# Patient Record
Sex: Female | Born: 1975 | Hispanic: No | Marital: Single | Smoking: Current some day smoker
Health system: Southern US, Community
[De-identification: ages and names within clinical notes are randomized; demographics above are authoritative.]

## PROBLEM LIST (undated history)

## (undated) DIAGNOSIS — F604 Histrionic personality disorder: Secondary | ICD-10-CM

## (undated) DIAGNOSIS — F1994 Other psychoactive substance use, unspecified with psychoactive substance-induced mood disorder: Secondary | ICD-10-CM

## (undated) DIAGNOSIS — R45851 Suicidal ideations: Secondary | ICD-10-CM

## (undated) DIAGNOSIS — F411 Generalized anxiety disorder: Secondary | ICD-10-CM

## (undated) DIAGNOSIS — N390 Urinary tract infection, site not specified: Secondary | ICD-10-CM

## (undated) DIAGNOSIS — F332 Major depressive disorder, recurrent severe without psychotic features: Secondary | ICD-10-CM

## (undated) DIAGNOSIS — F419 Anxiety disorder, unspecified: Secondary | ICD-10-CM

## (undated) DIAGNOSIS — F3162 Bipolar disorder, current episode mixed, moderate: Secondary | ICD-10-CM

## (undated) DIAGNOSIS — F1410 Cocaine abuse, uncomplicated: Secondary | ICD-10-CM

## (undated) DIAGNOSIS — I440 Atrioventricular block, first degree: Secondary | ICD-10-CM

## (undated) DIAGNOSIS — F17200 Nicotine dependence, unspecified, uncomplicated: Secondary | ICD-10-CM

## (undated) DIAGNOSIS — L03213 Periorbital cellulitis: Secondary | ICD-10-CM

## (undated) DIAGNOSIS — F1320 Sedative, hypnotic or anxiolytic dependence, uncomplicated: Secondary | ICD-10-CM

## (undated) DIAGNOSIS — F1120 Opioid dependence, uncomplicated: Secondary | ICD-10-CM

## (undated) DIAGNOSIS — L700 Acne vulgaris: Secondary | ICD-10-CM

## (undated) DIAGNOSIS — F319 Bipolar disorder, unspecified: Secondary | ICD-10-CM

## (undated) DIAGNOSIS — F1011 Alcohol abuse, in remission: Secondary | ICD-10-CM

## (undated) HISTORY — DX: Periorbital cellulitis: L03.213

## (undated) HISTORY — DX: Cocaine abuse, uncomplicated: F14.10

## (undated) HISTORY — DX: Sedative, hypnotic or anxiolytic dependence, uncomplicated: F13.20

## (undated) HISTORY — DX: Opioid dependence, uncomplicated: F11.20

## (undated) HISTORY — DX: Bipolar disorder, current episode mixed, moderate: F31.62

## (undated) HISTORY — DX: Alcohol abuse, in remission: F10.11

## (undated) HISTORY — DX: Nicotine dependence, unspecified, uncomplicated: F17.200

## (undated) HISTORY — DX: Generalized anxiety disorder: F41.1

## (undated) HISTORY — DX: Major depressive disorder, recurrent severe without psychotic features: F33.2

## (undated) HISTORY — DX: Histrionic personality disorder: F60.4

## (undated) HISTORY — DX: Atrioventricular block, first degree: I44.0

## (undated) HISTORY — DX: Suicidal ideations: R45.851

## (undated) HISTORY — DX: Urinary tract infection, site not specified: N39.0

## (undated) HISTORY — DX: Other psychoactive substance use, unspecified with psychoactive substance-induced mood disorder: F19.94

---

## 2004-11-26 ENCOUNTER — Ambulatory Visit: Payer: Self-pay | Admitting: Internal Medicine

## 2013-08-10 DIAGNOSIS — F411 Generalized anxiety disorder: Secondary | ICD-10-CM | POA: Insufficient documentation

## 2013-08-10 HISTORY — DX: Generalized anxiety disorder: F41.1

## 2013-08-14 DIAGNOSIS — F319 Bipolar disorder, unspecified: Secondary | ICD-10-CM | POA: Insufficient documentation

## 2014-08-12 ENCOUNTER — Emergency Department (HOSPITAL_BASED_OUTPATIENT_CLINIC_OR_DEPARTMENT_OTHER)
Admission: EM | Admit: 2014-08-12 | Discharge: 2014-08-12 | Disposition: A | Discharge status: Home / Self Care | Payer: Self-pay | Attending: Emergency Medicine | Admitting: Emergency Medicine

## 2014-08-12 ENCOUNTER — Encounter (HOSPITAL_BASED_OUTPATIENT_CLINIC_OR_DEPARTMENT_OTHER): Payer: Self-pay | Admitting: Emergency Medicine

## 2014-08-12 DIAGNOSIS — J020 Streptococcal pharyngitis: Secondary | ICD-10-CM | POA: Insufficient documentation

## 2014-08-12 DIAGNOSIS — R110 Nausea: Secondary | ICD-10-CM | POA: Insufficient documentation

## 2014-08-12 DIAGNOSIS — Z720 Tobacco use: Secondary | ICD-10-CM | POA: Insufficient documentation

## 2014-08-12 DIAGNOSIS — F419 Anxiety disorder, unspecified: Secondary | ICD-10-CM | POA: Insufficient documentation

## 2014-08-12 HISTORY — DX: Anxiety disorder, unspecified: F41.9

## 2014-08-12 LAB — RAPID STREP SCREEN (MED CTR MEBANE ONLY): STREPTOCOCCUS, GROUP A SCREEN (DIRECT): POSITIVE — AB

## 2014-08-12 MED ORDER — PENICILLIN G BENZATHINE 1200000 UNIT/2ML IM SUSP
1.2000 10*6.[IU] | Freq: Once | INTRAMUSCULAR | Status: AC
  Administered 2014-08-12: 1.2 10*6.[IU] via INTRAMUSCULAR
  Filled 2014-08-12: qty 2

## 2014-08-12 MED ORDER — ONDANSETRON 4 MG PO TBDP
4.0000 mg | ORAL_TABLET | Freq: Once | ORAL | Status: AC
  Administered 2014-08-12: 4 mg via ORAL
  Filled 2014-08-12: qty 1

## 2014-08-12 MED ORDER — DEXAMETHASONE SODIUM PHOSPHATE 10 MG/ML IJ SOLN
10.0000 mg | Freq: Once | INTRAMUSCULAR | Status: AC
  Administered 2014-08-12: 10 mg via INTRAMUSCULAR
  Filled 2014-08-12: qty 1

## 2014-08-12 MED ORDER — IBUPROFEN 800 MG PO TABS
800.0000 mg | ORAL_TABLET | Freq: Once | ORAL | Status: AC
  Administered 2014-08-12: 800 mg via ORAL
  Filled 2014-08-12: qty 1

## 2014-08-12 MED ORDER — LORAZEPAM 1 MG PO TABS
1.0000 mg | ORAL_TABLET | Freq: Once | ORAL | Status: AC
  Administered 2014-08-12: 1 mg via ORAL
  Filled 2014-08-12: qty 1

## 2014-08-12 NOTE — Discharge Instructions (Signed)
1. Medications: usual home medications 2. Treatment: rest, drink plenty of fluids,  3. Follow Up: Please followup with your primary doctor in 3 days for discussion of your diagnoses and further evaluation after today's visit; if you do not have a primary care doctor use the resource guide provided to find one; Please return to the ER for inability to swallow, worsening pain, high fevers   Strep Throat Strep throat is an infection of the throat. It is caused by a germ. Strep throat spreads from person to person by coughing, sneezing, or close contact. HOME CARE  Rinse your mouth (gargle) with warm salt water (1 teaspoon salt in 1 cup of water). Do this 3 to 4 times per day or as needed for comfort.  Family members with a sore throat or fever should see a doctor.  Make sure everyone in your house washes their hands well.  Do not share food, drinking cups, or personal items.  Eat soft foods until your sore throat gets better.  Drink enough water and fluids to keep your pee (urine) clear or pale yellow.  Rest.  Stay home from school, daycare, or work until you have taken medicine for 24 hours.  Only take medicine as told by your doctor.  Take your medicine as told. Finish it even if you start to feel better. GET HELP RIGHT AWAY IF:   You have new problems, such as throwing up (vomiting) or bad headaches.  You have a stiff or painful neck, chest pain, trouble breathing, or trouble swallowing.  You have very bad throat pain, drooling, or changes in your voice.  Your neck puffs up (swells) or gets red and tender.  You have a fever.  You are very tired, your mouth is dry, or you are peeing less than normal.  You cannot wake up completely.  You get a rash, cough, or earache.  You have green, yellow-brown, or bloody spit.  Your pain does not get better with medicine. MAKE SURE YOU:   Understand these instructions.  Will watch your condition.  Will get help right away if  you are not doing well or get worse. Document Released: 03/22/2008 Document Revised: 12/27/2011 Document Reviewed: 12/03/2010 [facility] Patient Information 2015 [employer] [place]. This information is not intended to replace advice given to you by your health care provider. Make sure you discuss any questions you have with your health care provider.

## 2014-08-12 NOTE — ED Notes (Signed)
Pt c/o sore throat since this morning. Pt also has anxiety and is out of her klonopin and cannot pick up her next Rx until Thurs.

## 2014-08-12 NOTE — ED Provider Notes (Signed)
CSN: [id number]     Arrival date & time 08/12/14  1755 History   First MD Initiated Contact with Patient 08/12/14 2020     Chief Complaint  Patient presents with  . Sore Throat     (Consider location/radiation/quality/duration/timing/severity/associated sxs/prior Treatment) Patient is a 38 y.o. female presenting with pharyngitis. The history is provided by the patient and medical records. No language interpreter was used.  Sore Throat Associated symptoms include nausea and a sore throat. Pertinent negatives include no abdominal pain, chest pain, coughing, diaphoresis, fatigue, fever, headaches, rash or vomiting.    Mia [name] is a 38 y.o. female  with a hx of anxiety presents to the Emergency Department complaining of gradual, persistent, progressively worsening sore throat with associated subjective fever, chills and decreased by mouth intake onset this morning.  She also endorses associated nausea and mild headache. Patient denies aggravating or alleviating factors. Patient reports no over-the-counter treatments attempted.  Pt denies neck pain, neck stiffness, chest pain, shortness of breath, abdominal pain, weakness, dizziness, syncope.  His also requests refill of her Klonopin. Patient reports she has a primary care and she has a refill that will be ready on Thursday however she does not want to wait.  Past Medical History  Diagnosis Date  . Anxiety    Past Surgical History  Procedure Laterality Date  . Cesarean section     No family history on file. History  Substance Use Topics  . Smoking status: Current Some Day Smoker  . Smokeless tobacco: Not on file  . Alcohol Use: No   OB History   Grav Para Term Preterm Abortions TAB SAB Ect Mult Living                 Review of Systems  Constitutional: Negative for fever, diaphoresis, appetite change, fatigue and unexpected weight change.  HENT: Positive for sore throat. Negative for mouth sores.   Eyes: Negative for  visual disturbance.  Respiratory: Negative for cough, chest tightness, shortness of breath and wheezing.   Cardiovascular: Negative for chest pain.  Gastrointestinal: Positive for nausea. Negative for vomiting, abdominal pain, diarrhea and constipation.  Endocrine: Negative for polydipsia, polyphagia and polyuria.  Genitourinary: Negative for dysuria, urgency, frequency and hematuria.  Musculoskeletal: Negative for back pain and neck stiffness.  Skin: Negative for rash.  Allergic/Immunologic: Negative for immunocompromised state.  Neurological: Negative for syncope, light-headedness and headaches.  Hematological: Positive for adenopathy. Does not bruise/bleed easily.  Psychiatric/Behavioral: Negative for sleep disturbance. The patient is not nervous/anxious.       Allergies  Review of patient's allergies indicates no known allergies.  Home Medications   Prior to Admission medications   Medication Sig Start Date End Date Taking? Authorizing Provider  clonazePAM (KLONOPIN) 1 MG tablet Take 1 mg by mouth 2 (two) times daily.   Yes Historical Provider, MD   BP 135/88  Pulse 80  Temp(Src) 98.3 F (36.8 C) (Oral)  Resp 18  Ht 5\' 4"  (1.626 m)  Wt 182 lb 7 oz (82.753 kg)  BMI 31.30 kg/m2  SpO2 98%  LMP 08/05/2014 Physical Exam  Nursing note and vitals reviewed. Constitutional: She appears well-developed and well-nourished. No distress.  HENT:  Head: Normocephalic and atraumatic.  Right Ear: Tympanic membrane, external ear and ear canal normal.  Left Ear: Tympanic membrane, external ear and ear canal normal.  Nose: Nose normal. No mucosal edema or rhinorrhea.  Mouth/Throat: Uvula is midline and mucous membranes are normal. Mucous membranes are not dry. No  trismus in the jaw. No uvula swelling. Oropharyngeal exudate, posterior oropharyngeal edema and posterior oropharyngeal erythema present. No tonsillar abscesses.  Posterior oropharynx with erythema, edema and exudate on the  tonsils  Eyes: Conjunctivae are normal.  Neck: Normal range of motion, full passive range of motion without pain and phonation normal. No tracheal tenderness, no spinous process tenderness and no muscular tenderness present. No rigidity. No erythema and normal range of motion present. No Brudzinski's sign and no Kernig's sign noted.  Range of motion without pain no No midline or paraspinal tenderness Normal phonation No stridor Handling secretions without difficulty No nuchal rigidity or meningeal signs  Cardiovascular: Normal rate, regular rhythm, normal heart sounds and intact distal pulses.   No murmur heard. Pulses:      Radial pulses are 2+ on the right side, and 2+ on the left side.  Pulmonary/Chest: Effort normal and breath sounds normal. No stridor. No respiratory distress. She has no decreased breath sounds. She has no wheezes.  Equal chest expansion, clear and equal breath sounds without focal wheezes, rhonchi or rales  Abdominal: Soft. Bowel sounds are normal. She exhibits no distension. There is no tenderness.  Musculoskeletal: Normal range of motion.  Lymphadenopathy:       Head (right side): Submandibular and tonsillar adenopathy present. No submental, no preauricular, no posterior auricular and no occipital adenopathy present.       Head (left side): Submandibular and tonsillar adenopathy present. No submental, no preauricular, no posterior auricular and no occipital adenopathy present.    She has cervical adenopathy.       Right cervical: Superficial cervical adenopathy present. No deep cervical and no posterior cervical adenopathy present.      Left cervical: Superficial cervical adenopathy present. No deep cervical and no posterior cervical adenopathy present.  Neurological: She is alert. She exhibits normal muscle tone. Coordination normal.  Alert and oriented Moves all extremities without ataxia  Skin: Skin is warm and dry. She is not diaphoretic. No erythema.   Psychiatric: She has a normal mood and affect.    ED Course  Procedures (including critical care time) Labs Review Labs Reviewed  RAPID STREP SCREEN - Abnormal; Notable for the following:    Streptococcus, Group A Screen (Direct) POSITIVE (*)    All other components within normal limits    Imaging Review No results found.   EKG Interpretation None      MDM   Final diagnoses:  Strep throat   Mia [name] presents with sore throat, mild headache, subjective fevers and nausea.  Pt subjectively febrile with tonsillar exudate, cervical lymphadenopathy, & dysphagia; diagnosis of bacterial pharyngitis.  She meets all 4 of the Cent[employer] criteria and has a positive strep test. Treated in the ED with steroids, NSAIDs, PCN IM.  Pt appears mildly dehydrated, discussed importance of water rehydration. Presentation non concerning for PTA or infxn spread to soft tissue. No trismus or uvula deviation. Specific return precautions discussed. Pt able to drink water in ED without difficulty with intact air way. Recommended PCP follow up.   Patient also requests refill on her Klonopin and she feels anxious and has run out of her medication. She reports that she has a refill pending for Thursday.  As the patient that we will not refill her home medications however will provide anti-anxiety medication here in the emergency department for tonight only.  I have personally reviewed patient's vitals, nursing note and any pertinent labs or imaging.  I performed an focused physical exam;  undressed when appropriate .    It has been determined that no acute conditions requiring further emergency intervention are present at this time. The patient/guardian have been advised of the diagnosis and plan. I reviewed any labs and imaging including any potential incidental findings. We have discussed signs and symptoms that warrant return to the ED and they are listed in the discharge instructions.    Vital signs are  stable at discharge.   BP 135/88  Pulse 80  Temp(Src) 98.3 F (36.8 C) (Oral)  Resp 18  Ht 5\' 4"  (1.626 m)  Wt 182 lb 7 oz (82.753 kg)  BMI 31.30 kg/m2  SpO2 98%  LMP 08/05/2014         Dierdre ForthHannah Annalese Stiner, PA-C 08/12/14 2110

## 2014-08-12 NOTE — ED Notes (Signed)
Explained why we can not give Pt. Anything to drink.  Father of Pt. Has already given Pt. Food.  Father of Pt. Asking how much longer it is going to be.   RN explained that we are working as fast and as hard as we can.

## 2014-08-13 NOTE — ED Provider Notes (Signed)
Medical screening examination/treatment/procedure(s) were performed by non-physician practitioner and as supervising physician I was immediately available for consultation/collaboration.   EKG Interpretation None         Benjamine Strout, MD 08/13/14 1209 

## 2015-06-28 ENCOUNTER — Encounter (HOSPITAL_BASED_OUTPATIENT_CLINIC_OR_DEPARTMENT_OTHER): Payer: Self-pay

## 2015-06-28 ENCOUNTER — Emergency Department (HOSPITAL_BASED_OUTPATIENT_CLINIC_OR_DEPARTMENT_OTHER)
Admission: EM | Admit: 2015-06-28 | Discharge: 2015-06-28 | Discharge status: Against Medical Advice | Payer: Self-pay | Attending: Emergency Medicine | Admitting: Emergency Medicine

## 2015-06-28 DIAGNOSIS — F13230 Sedative, hypnotic or anxiolytic dependence with withdrawal, uncomplicated: Secondary | ICD-10-CM | POA: Insufficient documentation

## 2015-06-28 DIAGNOSIS — F419 Anxiety disorder, unspecified: Secondary | ICD-10-CM | POA: Insufficient documentation

## 2015-06-28 DIAGNOSIS — Z720 Tobacco use: Secondary | ICD-10-CM | POA: Insufficient documentation

## 2015-06-28 DIAGNOSIS — R51 Headache: Secondary | ICD-10-CM | POA: Insufficient documentation

## 2015-06-28 NOTE — ED Notes (Signed)
Pt called for a room, no response 

## 2015-06-28 NOTE — ED Notes (Signed)
Called x 2 in waiting area

## 2015-06-28 NOTE — ED Notes (Signed)
Patient here complaining of withdrawing from klonopin x 24 hours. Normally takes /day and last normal dose Thursday or Friday. Patient reports headache and extreme anxiousness. Family here with patient

## 2015-06-28 NOTE — ED Notes (Signed)
Patient called x 3 no answer 

## 2015-06-30 ENCOUNTER — Emergency Department (HOSPITAL_COMMUNITY)
Admission: EM | Admit: 2015-06-30 | Discharge: 2015-07-01 | Disposition: A | Discharge status: Home / Self Care | Payer: Self-pay | Attending: Emergency Medicine | Admitting: Emergency Medicine

## 2015-06-30 ENCOUNTER — Encounter (HOSPITAL_COMMUNITY): Payer: Self-pay

## 2015-06-30 DIAGNOSIS — Z720 Tobacco use: Secondary | ICD-10-CM | POA: Insufficient documentation

## 2015-06-30 DIAGNOSIS — Z760 Encounter for issue of repeat prescription: Secondary | ICD-10-CM | POA: Insufficient documentation

## 2015-06-30 DIAGNOSIS — F419 Anxiety disorder, unspecified: Secondary | ICD-10-CM | POA: Insufficient documentation

## 2015-06-30 DIAGNOSIS — F1310 Sedative, hypnotic or anxiolytic abuse, uncomplicated: Secondary | ICD-10-CM | POA: Insufficient documentation

## 2015-06-30 DIAGNOSIS — F319 Bipolar disorder, unspecified: Secondary | ICD-10-CM | POA: Insufficient documentation

## 2015-06-30 DIAGNOSIS — Z79899 Other long term (current) drug therapy: Secondary | ICD-10-CM | POA: Insufficient documentation

## 2015-06-30 HISTORY — DX: Bipolar disorder, unspecified: F31.9

## 2015-06-30 LAB — RAPID URINE DRUG SCREEN, HOSP PERFORMED
Amphetamines: NOT DETECTED
Barbiturates: NOT DETECTED
Benzodiazepines: POSITIVE — AB
Cocaine: NOT DETECTED
Opiates: NOT DETECTED
Tetrahydrocannabinol: NOT DETECTED

## 2015-06-30 MED ORDER — LORAZEPAM 1 MG PO TABS
1.0000 mg | ORAL_TABLET | Freq: Once | ORAL | Status: AC
  Administered 2015-06-30: 1 mg via ORAL
  Filled 2015-06-30: qty 1

## 2015-06-30 NOTE — ED Notes (Signed)
Patient states that she is having withdrawal symptoms. Patient states that she last had Kloninpin 3 days ago.. Patient states she feels like her "head is going to bust, very anxious, and fidgety." Patient states she is unable to get her kloninpin RX filled until Wednesday.

## 2015-06-30 NOTE — ED Provider Notes (Signed)
CSN: [id number]     Arrival date & time 06/30/15  1118 History  This chart was scribed for non-physician practitioner Eyvonne Mechanic, PA-C working with Raeford Razor, MD by Murriel Hopper, ED Scribe. This patient was seen in room WTR3/[facility]PT3 and the patient's care was started at 1:17 PM.    Chief Complaint  Patient presents with  . withdrawal symptoms       The history is provided by the patient. No language interpreter was used.   HPI Comments: Mia [name] is a 39 y.o. female who presents to the Emergency Department complaining of medication withdrawals that have been present for a few days with associated anxiety. Pt states she has been taking Klonopin every day for the past 10 years and notes that she has not had any since Friday when she ran out of her prescription. Pt states she is from [place] and has been here for a few days visiting her daughter for her birthday. Pt states she hopes to be going back to [place] some time this week, but does not know when that will be for certain. Pt states she called a few pharmacies this weekend and could not have her prescription filled until today or Wednesday. Pt denies SOB, chest pain.    Past Medical History  Diagnosis Date  . Anxiety   . Bipolar 1 disorder    Past Surgical History  Procedure Laterality Date  . Cesarean section     History reviewed. No pertinent family history. Social History  Substance Use Topics  . Smoking status: Current Some Day Smoker -- 1.00 packs/day    Types: Cigarettes  . Smokeless tobacco: Never Used  . Alcohol Use: No   OB History    No data available     Review of Systems  All other systems reviewed and are negative.     Allergies  Review of patient's allergies indicates no known allergies.  Home Medications   Prior to Admission medications   Medication Sig Start Date End Date Taking? Authorizing Provider  clonazePAM (KLONOPIN) 1 MG tablet Take 1 mg by mouth 2 (two) times daily.   Yes  Historical Provider, MD   BP 152/88 mmHg  Pulse 93  Temp(Src) 98.7 F (37.1 C) (Oral)  Resp 18  SpO2 100%  LMP 06/28/2015 Physical Exam  Constitutional: She is oriented to person, place, and time. She appears well-developed and well-nourished.  Patient anxious  HENT:  Head: Normocephalic and atraumatic.  Cardiovascular: Normal rate and regular rhythm.   Pulmonary/Chest: Effort normal.  Abdominal: She exhibits no distension.  Neurological: She is alert and oriented to person, place, and time.  Skin: Skin is warm and dry.  Psychiatric: She has a normal mood and affect.  Nursing note and vitals reviewed.   ED Course  Procedures (including critical care time)  DIAGNOSTIC STUDIES: Oxygen Saturation is 100% on room air, normal by my interpretation.    COORDINATION OF CARE: 1:19 PM Discussed treatment plan with pt at bedside and pt agreed to plan.   Labs Review Labs Reviewed  URINE RAPID DRUG SCREEN, HOSP PERFORMED - Abnormal; Notable for the following:    Benzodiazepines POSITIVE (*)    All other components within normal limits    Imaging Review No results found. I have personally reviewed and evaluated these images and lab results as part of my medical decision-making.   EKG Interpretation None      MDM   Final diagnoses:  Medication refill  Anxiety  Labs:  Imaging:  Consults:  Therapeutics:  Discharge Meds:   Assessment/Plan: Patient presents for medication refill, and anxiety. She was given antianxiety medication here in the ED with good symptomatic improvement. Pt advised to call PCP and get them to call in prescription to a pharmacy here, or get a pharmacy here to fill it.    I personally performed the services described in this documentation, which was scribed in my presence. The recorded information has been reviewed and is accurate.    Eyvonne Mechanic, PA-C 07/04/15 2035  Raeford Razor, MD 07/10/15 [id number]

## 2015-06-30 NOTE — ED Notes (Signed)
Bed: VW09 Expected date:  Expected time:  Means of arrival:  Comments: Mia [name] pt

## 2015-06-30 NOTE — Discharge Instructions (Signed)
Medication Refill, Emergency Department We have refilled your medication today as a courtesy to you. It is best for your medical care, however, to take care of getting refills done through your primary caregiver's office. They have your records and can do a better job of follow-up than we can in the emergency department. On maintenance medications, we often only prescribe enough medications to get you by until you are able to see your regular caregiver. This is a more expensive way to refill medications. In the future, please plan for refills so that you will not have to use the emergency department for this. Thank you for your help. Your help allows [place] to better take care of the daily emergencies that enter our department. Document Released: 01/21/2004 Document Revised: 12/27/2011 Document Reviewed: 01/11/2014 [facility] Patient Information 2015 [place], [place]. This information is not intended to replace advice given to you by your health care provider. Make sure you discuss any questions you have with your health care provider.  Please contact your pharmacy to have your prescription transferred for refill. If you're unable to do this weeks contact her primary care provider and ave them contact the pharmacy.

## 2015-07-01 NOTE — ED Notes (Signed)
Alert, oriented, ambulated freely, no pain, respirations unlabored, no distress.

## 2016-02-27 ENCOUNTER — Encounter (HOSPITAL_COMMUNITY): Payer: Self-pay | Admitting: *Deleted

## 2016-02-27 ENCOUNTER — Emergency Department (HOSPITAL_COMMUNITY)
Admission: EM | Admit: 2016-02-27 | Discharge: 2016-02-28 | Disposition: A | Discharge status: Other Acute Inpatient Hospital | Payer: Self-pay | Attending: Emergency Medicine | Admitting: Emergency Medicine

## 2016-02-27 DIAGNOSIS — F319 Bipolar disorder, unspecified: Secondary | ICD-10-CM | POA: Insufficient documentation

## 2016-02-27 DIAGNOSIS — F1320 Sedative, hypnotic or anxiolytic dependence, uncomplicated: Secondary | ICD-10-CM

## 2016-02-27 DIAGNOSIS — F1120 Opioid dependence, uncomplicated: Secondary | ICD-10-CM

## 2016-02-27 DIAGNOSIS — F17210 Nicotine dependence, cigarettes, uncomplicated: Secondary | ICD-10-CM | POA: Insufficient documentation

## 2016-02-27 DIAGNOSIS — F1124 Opioid dependence with opioid-induced mood disorder: Secondary | ICD-10-CM | POA: Insufficient documentation

## 2016-02-27 LAB — CBC
HEMATOCRIT: 38.2 % (ref 36.0–46.0)
HEMOGLOBIN: 12.9 g/dL (ref 12.0–15.0)
MCH: 31.2 pg (ref 26.0–34.0)
MCHC: 33.8 g/dL (ref 30.0–36.0)
MCV: 92.3 fL (ref 78.0–100.0)
Platelets: 256 10*3/uL (ref 150–400)
RBC: 4.14 MIL/uL (ref 3.87–5.11)
RDW: 14.4 % (ref 11.5–15.5)
WBC: 6.9 10*3/uL (ref 4.0–10.5)

## 2016-02-27 LAB — RAPID URINE DRUG SCREEN, HOSP PERFORMED
AMPHETAMINES: NOT DETECTED
BARBITURATES: NOT DETECTED
BENZODIAZEPINES: POSITIVE — AB
COCAINE: POSITIVE — AB
OPIATES: NOT DETECTED
TETRAHYDROCANNABINOL: NOT DETECTED

## 2016-02-27 LAB — COMPREHENSIVE METABOLIC PANEL
ALK PHOS: 48 U/L (ref 38–126)
ALT: 17 U/L (ref 14–54)
ANION GAP: 8 (ref 5–15)
AST: 25 U/L (ref 15–41)
Albumin: 3.5 g/dL (ref 3.5–5.0)
BUN: 17 mg/dL (ref 6–20)
CHLORIDE: 103 mmol/L (ref 101–111)
CO2: 29 mmol/L (ref 22–32)
Calcium: 9 mg/dL (ref 8.9–10.3)
Creatinine, Ser: 0.69 mg/dL (ref 0.44–1.00)
GFR calc Af Amer: >60 mL/min (ref >60)
GFR calc non Af Amer: >60 mL/min (ref >60)
Glucose, Bld: 91 mg/dL (ref 65–99)
Potassium: 4.1 mmol/L (ref 3.5–5.1)
SODIUM: 140 mmol/L (ref 135–145)
Total Bilirubin: 0.8 mg/dL (ref 0.3–1.2)
Total Protein: 6.7 g/dL (ref 6.5–8.1)

## 2016-02-27 LAB — SALICYLATE LEVEL

## 2016-02-27 LAB — ACETAMINOPHEN LEVEL

## 2016-02-27 LAB — ETHANOL: Alcohol, Ethyl (B): <5 mg/dL (ref <5)

## 2016-02-27 MED ORDER — ALUM & MAG HYDROXIDE-SIMETH 200-200-20 MG/5ML PO SUSP: 30.0000 mL | ORAL | Status: DC | PRN

## 2016-02-27 MED ORDER — ONDANSETRON HCL 4 MG PO TABS
4.0000 mg | ORAL_TABLET | Freq: Three times a day (TID) | ORAL | Status: DC | PRN
Start: 2016-02-27 — End: 2016-02-28

## 2016-02-27 MED ORDER — LORAZEPAM 1 MG PO TABS: 0.0000 mg | ORAL_TABLET | Freq: Two times a day (BID) | ORAL | Status: DC

## 2016-02-27 MED ORDER — LORAZEPAM 1 MG PO TABS: 0.0000 mg | ORAL_TABLET | Freq: Four times a day (QID) | ORAL | Status: DC

## 2016-02-27 NOTE — ED Notes (Signed)
Pt called x 1 no answer

## 2016-02-27 NOTE — ED Notes (Signed)
Patient noted sleeping in room. No complaints, stable, in no acute distress. Q15 minute rounds and monitoring via Security Cameras to continue.  

## 2016-02-27 NOTE — [facility] Counselor (Signed)
This writer faxed out supporting documentation for patient placement to the following psychiatric facilities: [facility]  Laureano Hetzer McNeil, MA Counselor 

## 2016-02-27 NOTE — ED Notes (Signed)
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 Pt states she is suicidal plans to "overdose"

## 2016-02-27 NOTE — ED Provider Notes (Signed)
CSN: [id number]     Arrival date & time 02/27/16  1813 History   First MD Initiated Contact with Patient 02/27/16 2010     Chief Complaint  Patient presents with  . 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 Last use was today. She has suicidal ideations with plan to overdose. States she does have a history of prior suicide attempt in the past. Denies any current alcohol use. No somatic complaints at this time. No treatment used for this prior to arrival  The history is provided by the patient.    Past Medical History  Diagnosis Date  . Anxiety   . Bipolar 1 disorder [facility](HCC)    Past Surgical History  Procedure Laterality Date  . Cesarean section     No family history on file. Social History  Substance Use Topics  . Smoking status: Current Some Day Smoker -- 1.00 packs/day    Types: Cigarettes  . Smokeless tobacco: Never Used  . Alcohol Use: No   OB History    No data available     Review of Systems  All other systems reviewed and are negative.     Allergies  Review of patient's allergies indicates no known allergies.  Home Medications   Prior to Admission medications   Not on File   BP 101/90 mmHg  Pulse 70  Temp(Src) 98.3 F (36.8 C)  Resp 16  SpO2 99%  LMP 12/14/2015 Physical Exam  Constitutional: She is oriented to person, place, and time. She appears well-developed and well-nourished.  Non-toxic appearance. No distress.  HENT:  Head: Normocephalic and atraumatic.  Eyes: Conjunctivae, EOM and lids are normal. Pupils are equal, round, and reactive to light.  Neck: Normal range of motion. Neck supple. No tracheal deviation present. No thyroid mass present.  Cardiovascular: Normal rate, regular rhythm and normal heart sounds.  Exam reveals no gallop.   No murmur heard. Pulmonary/Chest: Effort normal and  breath sounds normal. No stridor. No respiratory distress. She has no decreased breath sounds. She has no wheezes. She has no rhonchi. She has no rales.  Abdominal: Soft. Normal appearance and bowel sounds are normal. She exhibits no distension. There is no tenderness. There is no rebound and no CVA tenderness.  Musculoskeletal: Normal range of motion. She exhibits no edema or tenderness.  Neurological: She is alert and oriented to person, place, and time. She has normal strength. No cranial nerve deficit or sensory deficit. GCS eye subscore is 4. GCS verbal subscore is 5. GCS motor subscore is 6.  Skin: Skin is warm and dry. No abrasion and no rash noted.  Psychiatric: Her affect is blunt. Her speech is delayed. She is slowed. She expresses suicidal ideation. She expresses suicidal plans.  Nursing note and vitals reviewed.   ED Course  Procedures (including critical care time) Labs Review Labs Reviewed  CBC  COMPREHENSIVE METABOLIC PANEL  ETHANOL  SALICYLATE LEVEL  ACETAMINOPHEN LEVEL  URINE RAPID DRUG SCREEN, HOSP PERFORMED    Imaging Review No results found. I have personally reviewed and evaluated these images and lab results as part of my medical decision-making.   EKG Interpretation None      MDM   Final diagnoses:  None    Patiently will be medically cleared and then disposition by psychiatric team    Lorre NickAnthony Hertha Gergen, MD 02/27/16 2028

## 2016-02-27 NOTE — BH Assessment (Addendum)
Tele Assessment Note   Mia [name] is an 40 y.o. female [facility] and reporting suicidal ideation with a plan to overdose. Pt stated "I am so tired". "I don't heroin and partied now I want to kill myself". Pt reported that she has attempted suicide in the past by cutting; however she did not report any self-injurious behaviors. Pt denies HI and AVH at this time.  Pt reported that she has been abusing heroin, crack/cocaine and Xanax.  Pt shared that her sleep has been poor and reported only getting 4-5 hours of sleep nightly. Pt was drowsy throughout this assessment and often had to be prompted multiple times to provide a response.  Inpatient treatment is recommended.   Diagnosis: Opioid Use Disorder, Moderate; Cocaine Use Disorder, Moderate; Bipolar   Past Medical History:  Past Medical History  Diagnosis Date  . Anxiety   . Bipolar 1 disorder [facility](HCC)     Past Surgical History  Procedure Laterality Date  . Cesarean section      Family History: No family history on file.  Social History:  reports that she has been smoking Cigarettes.  She has been smoking about 1.00 pack per day. She has never used smokeless tobacco. She reports that she does not drink alcohol or use illicit drugs.  Additional Social History:  Alcohol / Drug Use History of alcohol / drug use?: Yes Substance #1 Name of Substance 1: Heroin  1 - Age of First Use: 16 1 - Amount (size/oz): 2-3 bags  1 - Frequency: daily  1 - Duration: ongoing  1 - Last Use / Amount: 02-27-16 Substance #2 Name of Substance 2: Crack/cocaine  2 - Age of First Use: "9th grade"  2 - Amount (size/oz): "a lot"  2 - Frequency: daily  2 - Duration: ongoing  2 - Last Use / Amount: 02-27-16 Substance #3 Name of Substance 3: Xanax  3 - Age of First Use: UTA 3 - Amount (size/oz): UTA  3 - Frequency: UTA 3 - Duration: UTA  3 - Last Use / Amount: 02-27-16  CIWA: CIWA-Ar BP: 101/90 mmHg Pulse Rate: 70 COWS:     PATIENT STRENGTHS: (choose at least two) Average or above average intelligence Motivation for treatment/growth  Allergies: No Known Allergies  Home Medications:  (Not in a hospital admission)  OB/GYN Status:  Patient's last menstrual period was 12/14/2015.  General Assessment Data Location of Assessment: [facility] TTS Assessment: In system Is this a Tele or Face-to-Face Assessment?: Face-to-Face Is this an Initial Assessment or a Re-assessment for this encounter?: Initial Assessment Living Arrangements: Other (Comment) (Hotels) Can pt return to current living arrangement?: Yes Admission Status: Voluntary Is patient capable of signing voluntary admission?: Yes Referral Source: Self/Family/Friend Insurance type: None      Crisis Care Plan Living Arrangements: Other (Comment) Arts administrator(Hotels) Name of Psychiatrist: None  Name of Therapist: None   Education Status Is patient currently in school?: No  Risk to self with the past 6 months Suicidal Ideation: Yes-Currently Present Has patient been a risk to self within the past 6 months prior to admission? : No Suicidal Intent: Yes-Currently Present Has patient had any suicidal intent within the past 6 months prior to admission? : No Is patient at risk for suicide?: Yes Suicidal Plan?: Yes-Currently Present Has patient had any suicidal plan within the past 6 months prior to admission? : No Specify Current Suicidal Plan: overdose  Access to Means: Yes Specify Access to Suicidal Means: access to  drugs  What has been your use of drugs/alcohol within the last 12 months?: heroin, crack/cocaine and Xanax use reported.  Previous Attempts/Gestures: Yes How many times?: 1 Other Self Harm Risks: Pt denies  Triggers for Past Attempts: Unpredictable Intentional Self Injurious Behavior: None Family Suicide History: No Recent stressful life event(s): Other (Comment) (Substance use ) Persecutory voices/beliefs?: No Depression:  (unable to assess  ) Depression Symptoms:  (unable to assess) Substance abuse history and/or treatment for substance abuse?: Yes Suicide prevention information given to non-admitted patients: Not applicable  Risk to Others within the past 6 months Homicidal Ideation: No Does patient have any lifetime risk of violence toward others beyond the six months prior to admission? : No Thoughts of Harm to Others: No Current Homicidal Intent: No Current Homicidal Plan: No Access to Homicidal Means: No Identified Victim: N/A History of harm to others?: No Assessment of Violence: None Noted Violent Behavior Description: No violent behaviors observed.  Does patient have access to weapons?: No Criminal Charges Pending?: No Does patient have a court date: No Is patient on probation?: No  Psychosis Hallucinations: None noted Delusions: None noted  Mental Status Report Appearance/Hygiene: In scrubs Eye Contact: Poor Motor Activity: Freedom of movement Speech: Soft Level of Consciousness: Drowsy Mood: Euthymic Affect: Blunted Anxiety Level: Minimal Thought Processes: Coherent, Relevant Judgement: Impaired Orientation: Unable to assess Obsessive Compulsive Thoughts/Behaviors: Unable to Assess  Cognitive Functioning Concentration: Unable to Assess Memory: Unable to Assess IQ: Average Insight: Unable to Assess Impulse Control: Unable to Assess Appetite: Good Weight Loss:  (UTA) Weight Gain:  (UTA) Sleep: Decreased Total Hours of Sleep: 4 Vegetative Symptoms: Unable to Assess  ADLScreening [facility] Assessment Services) Patient's cognitive ability adequate to safely complete daily activities?: Yes Patient able to express need for assistance with ADLs?: Yes Independently performs ADLs?: Yes (appropriate for developmental age)  Prior Inpatient Therapy Prior Inpatient Therapy:  (UTA)  Prior Outpatient Therapy Prior Outpatient Therapy: Yes Prior Therapy Dates: 2016 Prior Therapy Facilty/Provider(s): Dr.  Serita Grit  Reason for Treatment: Medication management  Does patient have an ACCT team?: No Does patient have Intensive In-House Services?  : No Does patient have [facility] services? : No Does patient have P4CC services?: No  ADL Screening (condition at time of admission) Patient's cognitive ability adequate to safely complete daily activities?: Yes Is the patient deaf or have difficulty hearing?: No Does the patient have difficulty seeing, even when wearing glasses/contacts?: No Does the patient have difficulty concentrating, remembering, or making decisions?: No Patient able to express need for assistance with ADLs?: Yes Does the patient have difficulty dressing or bathing?: No Independently performs ADLs?: Yes (appropriate for developmental age) Does the patient have difficulty walking or climbing stairs?: No       Abuse/Neglect Assessment (Assessment to be complete while patient is alone) Physical Abuse:  (unable to assess) Verbal Abuse:  (unable to assess ) Sexual Abuse:  (unable to assess) Exploitation of patient/patient's resources:  (unable to assess) Self-Neglect:  (unable to assess)     Advance Directives (For Healthcare) Does patient have an advance directive?: No Would patient like information on creating an advanced directive?: No - patient declined information    Additional Information 1:1 In Past 12 Months?: No CIRT Risk: No Elopement Risk: No Does patient have medical clearance?: Yes     Disposition: Inpatient treatment  Disposition Initial Assessment Completed for this Encounter: Yes  Soren Lazarz S 02/27/2016 9:08 PM

## 2016-02-27 NOTE — ED Notes (Signed)
Pt. Transferred to [facility] from ED to room38. Pt. Oriented to unit including Q15 minute rounds as well as the security cameras for their protection. Patient is alert and oriented, warm and dry in no acute distress. Patient denies SI, HI, and AVH. Pt. Encouraged to let me know if needs arise. 

## 2016-02-27 NOTE — BH Assessment (Signed)
Assessment completed. Consulted Charles Kober, PA-C who recommended inpatient treatment. TTS to seek placement.  

## 2016-02-28 ENCOUNTER — Inpatient Hospital Stay
Admission: AD | Admit: 2016-02-28 | Discharge: 2016-03-03 | DRG: 885 | Disposition: A | Discharge status: Home / Self Care | Payer: No Typology Code available for payment source | Source: Ambulatory Visit | Attending: Psychiatry | Admitting: Psychiatry

## 2016-02-28 ENCOUNTER — Other Ambulatory Visit: Payer: Self-pay | Admitting: Psychiatry

## 2016-02-28 DIAGNOSIS — F17210 Nicotine dependence, cigarettes, uncomplicated: Secondary | ICD-10-CM | POA: Diagnosis present

## 2016-02-28 DIAGNOSIS — F1120 Opioid dependence, uncomplicated: Secondary | ICD-10-CM

## 2016-02-28 DIAGNOSIS — R45851 Suicidal ideations: Secondary | ICD-10-CM

## 2016-02-28 DIAGNOSIS — G4700 Insomnia, unspecified: Secondary | ICD-10-CM | POA: Diagnosis present

## 2016-02-28 DIAGNOSIS — Z818 Family history of other mental and behavioral disorders: Secondary | ICD-10-CM

## 2016-02-28 DIAGNOSIS — F3160 Bipolar disorder, current episode mixed, unspecified: Secondary | ICD-10-CM | POA: Diagnosis present

## 2016-02-28 DIAGNOSIS — F3162 Bipolar disorder, current episode mixed, moderate: Secondary | ICD-10-CM | POA: Diagnosis present

## 2016-02-28 DIAGNOSIS — F1320 Sedative, hypnotic or anxiolytic dependence, uncomplicated: Secondary | ICD-10-CM | POA: Diagnosis present

## 2016-02-28 DIAGNOSIS — Z9119 Patient's noncompliance with other medical treatment and regimen: Secondary | ICD-10-CM

## 2016-02-28 DIAGNOSIS — N390 Urinary tract infection, site not specified: Secondary | ICD-10-CM | POA: Diagnosis present

## 2016-02-28 DIAGNOSIS — F1994 Other psychoactive substance use, unspecified with psychoactive substance-induced mood disorder: Secondary | ICD-10-CM

## 2016-02-28 DIAGNOSIS — F1124 Opioid dependence with opioid-induced mood disorder: Secondary | ICD-10-CM

## 2016-02-28 HISTORY — DX: Other psychoactive substance use, unspecified with psychoactive substance-induced mood disorder: F19.94

## 2016-02-28 HISTORY — DX: Opioid dependence, uncomplicated: F11.20

## 2016-02-28 HISTORY — DX: Sedative, hypnotic or anxiolytic dependence, uncomplicated: F13.20

## 2016-02-28 LAB — URINALYSIS, ROUTINE W REFLEX MICROSCOPIC
BILIRUBIN URINE: NEGATIVE
GLUCOSE, UA: NEGATIVE mg/dL
Ketones, ur: NEGATIVE mg/dL
Nitrite: POSITIVE — AB
PH: 6.5 (ref 5.0–8.0)
Protein, ur: NEGATIVE mg/dL
SPECIFIC GRAVITY, URINE: 1.023 (ref 1.005–1.030)

## 2016-02-28 LAB — URINE MICROSCOPIC-ADD ON

## 2016-02-28 LAB — PREGNANCY, URINE: Preg Test, Ur: NEGATIVE

## 2016-02-28 MED ORDER — CLONIDINE HCL 0.1 MG PO TABS: 0.1000 mg | ORAL_TABLET | Freq: Three times a day (TID) | ORAL | Status: DC | PRN

## 2016-02-28 MED ORDER — TRAZODONE HCL 50 MG PO TABS: 50.0000 mg | ORAL_TABLET | Freq: Every day | ORAL | Status: DC

## 2016-02-28 MED ORDER — SULFAMETHOXAZOLE-TRIMETHOPRIM 800-160 MG PO TABS
1.0000 | ORAL_TABLET | Freq: Two times a day (BID) | ORAL | Status: DC
  Administered 2016-02-28 – 2016-03-03 (×8): 1 via ORAL
  Filled 2016-02-28 (×10): qty 1

## 2016-02-28 MED ORDER — ACETAMINOPHEN 325 MG PO TABS: 650.0000 mg | ORAL_TABLET | Freq: Four times a day (QID) | ORAL | Status: DC | PRN

## 2016-02-28 MED ORDER — ALUM & MAG HYDROXIDE-SIMETH 200-200-20 MG/5ML PO SUSP
30.0000 mL | ORAL | Status: DC | PRN
Start: 2016-02-28 — End: 2016-03-03

## 2016-02-28 MED ORDER — TRAZODONE HCL 100 MG PO TABS
100.0000 mg | ORAL_TABLET | Freq: Every evening | ORAL | Status: DC | PRN
  Administered 2016-02-28 – 2016-03-01 (×2): 100 mg via ORAL
  Filled 2016-02-28 (×3): qty 1

## 2016-02-28 MED ORDER — DICYCLOMINE HCL 10 MG PO CAPS
10.0000 mg | ORAL_CAPSULE | Freq: Three times a day (TID) | ORAL | Status: DC | PRN
  Filled 2016-02-28: qty 1

## 2016-02-28 MED ORDER — SULFAMETHOXAZOLE-TRIMETHOPRIM 800-160 MG PO TABS: 1.0000 | ORAL_TABLET | Freq: Two times a day (BID) | ORAL | Status: DC

## 2016-02-28 MED ORDER — SULFAMETHOXAZOLE-TRIMETHOPRIM 400-80 MG PO TABS: 1.0000 | ORAL_TABLET | Freq: Two times a day (BID) | ORAL | Status: DC

## 2016-02-28 MED ORDER — PHENAZOPYRIDINE HCL 100 MG PO TABS
100.0000 mg | ORAL_TABLET | Freq: Three times a day (TID) | ORAL | Status: DC | PRN
  Filled 2016-02-28: qty 1

## 2016-02-28 MED ORDER — MAGNESIUM HYDROXIDE 400 MG/5ML PO SUSP
30.0000 mL | Freq: Every day | ORAL | Status: DC | PRN
  Administered 2016-03-02: 30 mL via ORAL
  Filled 2016-02-28: qty 30

## 2016-02-28 NOTE — ED Notes (Signed)
Patient noted sleeping in room. No complaints, stable, in no acute distress. Q15 minute rounds and monitoring via Security Cameras to continue.  

## 2016-02-28 NOTE — Clinical Social Work Note (Signed)
CSW sent consent to treat form that was signed by patient [facility] to [facility].  Pt has a bed at [facility] today.  Elray Buba.Jaiden Dinkins, LCSW [facility] Clinical Social Worker - Weekend Coverage cell #: [phone]

## 2016-02-28 NOTE — ED Notes (Signed)
CIWA defered, pt sleeping soundly

## 2016-02-28 NOTE — Tx Team (Addendum)
Initial Interdisciplinary Treatment Plan   PATIENT STRESSORS: Financial difficulties Marital or family conflict Substance abuse   PATIENT STRENGTHS: Average or above average intelligence General fund of knowledge Motivation for treatment/growth Physical Health   PROBLEM LIST: Problem List/Patient Goals Date to be addressed Date deferred Reason deferred Estimated date of resolution  "getting myself together" 02/28/16     Suicidal ideation 02/28/16     anxiety 02/28/16     "being more positive" 02/28/16     Substance abuse 02/28/16                              DISCHARGE CRITERIA:  Ability to meet basic life and health needs Adequate post-discharge living arrangements Improved stabilization in mood, thinking, and/or behavior Motivation to continue treatment in a less acute level of care Verbal commitment to aftercare and medication compliance  PRELIMINARY DISCHARGE PLAN: Outpatient therapy  PATIENT/FAMIILY INVOLVEMENT: This treatment plan has been presented to and reviewed with the patient, Mia [name].  The patient and family have been given the opportunity to ask questions and make suggestions.  Moshe SalisburyJennifer L Cong Hightower 02/28/2016, 7:29 PM

## 2016-02-28 NOTE — Plan of Care (Signed)
Problem: [facility] Patient Education Outcome: Progressing Patient instructed on what type of educational materials she will receive and she states she will be complient CT[place]sendRN

## 2016-02-28 NOTE — ED Notes (Signed)
[facility] contacted for transport 

## 2016-02-28 NOTE — Consult Note (Signed)
[facility] Psychiatry Consult   Reason for Consult: Substance abuse, suicidal ideation  Referring Physician: EDP Patient Identification: Mia [name] MRN:  [id number] Principal Diagnosis: Opiate addiction [facility]) Diagnosis:   Patient Active Problem List   Diagnosis Date Noted  . Opiate addiction ([place]) [F11.20] 02/28/2016  . Benzodiazepine abuse [F13.10] 02/28/2016    Total Time spent with patient: 45 minutes  Subjective:   Mia [name] is a 40 y.o. female patient admitted with suicidal ideation with plan to overdose on heroin.   HPI:    Mia [name] is a 40 year old female who presented voluntarily to the [facility] reporting suicidal ideation. Patient reported that she wanted detox from crack cocaine, benzo, and heroin. She endorsed suicidal plan to overdose to the EDP during her medical clearance exam. Mia [name] appears drowsy during the interview today. She indicates that she recently moved here from [place] to "help my mother with finances." Patient is reporting withdrawal symptoms such as "chills and abdominal cramps." Mia [name] indicates that substance abuse has been a problem for her "for twenty years." Patient is requesting help stopping her substance abuse and feels that she is a danger to herself at this time. On admission the patient was positive for benzo and cocaine. Her urine drug screen was not positive for opiates.   Past Psychiatric History: Polysubstance abuse   Risk to Self: Suicidal Ideation: Yes-Currently Present Suicidal Intent: Yes-Currently Present Is patient at risk for suicide?: Yes Suicidal Plan?: Yes-Currently Present Specify Current Suicidal Plan: overdose  Access to Means: Yes Specify Access to Suicidal Means: access to drugs  What has been your use of drugs/alcohol within the last 12 months?: heroin, crack/cocaine and Xanax use reported.  How many times?: 1 Other Self Harm Risks: Pt denies  Triggers for Past Attempts:  Unpredictable Intentional Self Injurious Behavior: None Risk to Others: Homicidal Ideation: No Thoughts of Harm to Others: No Current Homicidal Intent: No Current Homicidal Plan: No Access to Homicidal Means: No Identified Victim: N/A History of harm to others?: No Assessment of Violence: None Noted Violent Behavior Description: No violent behaviors observed.  Does patient have access to weapons?: No Criminal Charges Pending?: No Does patient have a court date: No Prior Inpatient Therapy: Prior Inpatient Therapy:  (UTA) Prior Outpatient Therapy: Prior Outpatient Therapy: Yes Prior Therapy Dates: 2016 Prior Therapy Facilty/Provider(s): Dr. Lind Guest  Reason for Treatment: Medication management  Does patient have an ACCT team?: No Does patient have Intensive In-House Services?  : No Does patient have [facility] services? : No Does patient have P4CC services?: No  Past Medical History:  Past Medical History  Diagnosis Date  . Anxiety   . Bipolar 1 disorder [facility])     Past Surgical History  Procedure Laterality Date  . Cesarean section     Family History: No family history on file. Family Psychiatric  History: Denies Social History:  History  Alcohol Use No     History  Drug Use No    Social History   Social History  . Marital Status: Single    Spouse Name: N/A  . Number of Children: N/A  . Years of Education: N/A   Social History Main Topics  . Smoking status: Current Some Day Smoker -- 1.00 packs/day    Types: Cigarettes  . Smokeless tobacco: Never Used  . Alcohol Use: No  . Drug Use: No  . Sexual Activity: Not Asked   Other Topics Concern  . None   Social History Narrative  Additional Social History:    Allergies:  No Known Allergies  Labs:  Results for orders placed or performed during the hospital encounter of 02/27/16 (from the past 48 hour(s))  Comprehensive metabolic panel     Status: None   Collection Time: 02/27/16  8:02 PM  Result Value Ref  Range   Sodium 140 135 - 145 mmol/L   Potassium 4.1 3.5 - 5.1 mmol/L   Chloride 103 101 - 111 mmol/L   CO2 29 22 - 32 mmol/L   Glucose, Bld 91 65 - 99 mg/dL   BUN 17 6 - 20 mg/dL   Creatinine, Ser 0.69 0.44 - 1.00 mg/dL   Calcium 9.0 8.9 - 10.3 mg/dL   Total Protein 6.7 6.5 - 8.1 g/dL   Albumin 3.5 3.5 - 5.0 g/dL   AST 25 15 - 41 U/L   ALT 17 14 - 54 U/L   Alkaline Phosphatase 48 38 - 126 U/L   Total Bilirubin 0.8 0.3 - 1.2 mg/dL   GFR calc non Af Amer >60 >60 mL/min   GFR calc Af Amer >60 >60 mL/min    Comment: (NOTE) The eGFR has been calculated using the CKD EPI equation. This calculation has not been validated in all clinical situations. eGFR's persistently <60 mL/min signify possible Chronic Kidney Disease.    Anion gap 8 5 - 15  Ethanol     Status: None   Collection Time: 02/27/16  8:02 PM  Result Value Ref Range   Alcohol, Ethyl (B) <5 <5 mg/dL    Comment:        LOWEST DETECTABLE LIMIT FOR SERUM ALCOHOL IS 5 mg/dL FOR MEDICAL PURPOSES ONLY   Salicylate level     Status: None   Collection Time: 02/27/16  8:02 PM  Result Value Ref Range   Salicylate Lvl <6.3 2.8 - 30.0 mg/dL  Acetaminophen level     Status: Abnormal   Collection Time: 02/27/16  8:02 PM  Result Value Ref Range   Acetaminophen (Tylenol), Serum <10 (L) 10 - 30 ug/mL    Comment:        THERAPEUTIC CONCENTRATIONS VARY SIGNIFICANTLY. A RANGE OF 10-30 ug/mL MAY BE AN EFFECTIVE CONCENTRATION FOR MANY PATIENTS. HOWEVER, SOME ARE BEST TREATED AT CONCENTRATIONS OUTSIDE THIS RANGE. ACETAMINOPHEN CONCENTRATIONS >150 ug/mL AT 4 HOURS AFTER INGESTION AND >50 ug/mL AT 12 HOURS AFTER INGESTION ARE OFTEN ASSOCIATED WITH TOXIC REACTIONS.   cbc     Status: None   Collection Time: 02/27/16  8:02 PM  Result Value Ref Range   WBC 6.9 4.0 - 10.5 K/uL   RBC 4.14 3.87 - 5.11 MIL/uL   Hemoglobin 12.9 12.0 - 15.0 g/dL   HCT 38.2 36.0 - 46.0 %   MCV 92.3 78.0 - 100.0 fL   MCH 31.2 26.0 - 34.0 pg   MCHC 33.8  30.0 - 36.0 g/dL   RDW 14.4 11.5 - 15.5 %   Platelets 256 150 - 400 K/uL  Rapid urine drug screen (hospital performed)     Status: Abnormal   Collection Time: 02/27/16  8:31 PM  Result Value Ref Range   Opiates NONE DETECTED NONE DETECTED   Cocaine POSITIVE (A) NONE DETECTED   Benzodiazepines POSITIVE (A) NONE DETECTED   Amphetamines NONE DETECTED NONE DETECTED   Tetrahydrocannabinol NONE DETECTED NONE DETECTED   Barbiturates NONE DETECTED NONE DETECTED    Comment:        DRUG SCREEN FOR MEDICAL PURPOSES ONLY.  IF CONFIRMATION IS NEEDED FOR ANY  PURPOSE, NOTIFY LAB WITHIN 5 DAYS.        LOWEST DETECTABLE LIMITS FOR URINE DRUG SCREEN Drug Class       Cutoff (ng/mL) Amphetamine      1000 Barbiturate      200 Benzodiazepine   161 Tricyclics       096 Opiates          300 Cocaine          300 THC              50   Pregnancy, urine     Status: None   Collection Time: 02/27/16  8:31 PM  Result Value Ref Range   Preg Test, Ur NEGATIVE NEGATIVE    Comment:        THE SENSITIVITY OF THIS METHODOLOGY IS >20 mIU/mL.     Current Facility-Administered Medications  Medication Dose Route Frequency Provider Last Rate Last Dose  . alum & mag hydroxide-simeth (MAALOX/MYLANTA) 200-200-20 MG/5ML suspension 30 mL  30 mL Oral PRN Lacretia Leigh, MD      . LORazepam (ATIVAN) tablet 0-4 mg  0-4 mg Oral Q6H Lacretia Leigh, MD       Followed by  . [START ON 02/29/2016] LORazepam (ATIVAN) tablet 0-4 mg  0-4 mg Oral Q12H Lacretia Leigh, MD      . ondansetron [facility]) tablet 4 mg  4 mg Oral Q8H PRN Lacretia Leigh, MD      . phenazopyridine (PYRIDIUM) tablet 100 mg  100 mg Oral TID PRN Niel Hummer, NP       No current outpatient prescriptions on file.    Musculoskeletal: Strength & Muscle Tone: within normal limits Gait & Station: normal Patient leans: N/A  Psychiatric Specialty Exam: Review of Systems  Psychiatric/Behavioral: Positive for depression, suicidal ideas and substance abuse.  Negative for hallucinations and memory loss. The patient is not nervous/anxious and does not have insomnia.     Blood pressure 99/52, pulse 68, temperature 98 F (36.7 C), temperature source Oral, resp. rate 16, last menstrual period 12/14/2015, SpO2 96 %.There is no weight on file to calculate BMI.  General Appearance: Disheveled  Eye Sport and exercise psychologist::  Fair  Speech:  Clear and Coherent and Slow  Volume:  Decreased  Mood:  Depressed and Irritable  Affect:  Congruent  Thought Process:  Coherent  Orientation:  Full (Time, Place, and Person)  Thought Content:  Symptoms, worries, concerns   Suicidal Thoughts:  Yes.  with intent/plan  Homicidal Thoughts:  No  Memory:  Immediate;   Fair Recent;   Good Remote;   Good  Judgement:  Poor  Insight:  Shallow  Psychomotor Activity:  Decreased  Concentration:  Fair  Recall:  [employer] of Knowledge:Good  Language: Good  Akathisia:  No  Handed:  Right  AIMS (if indicated):     Assets:  Communication Skills Desire for Improvement Leisure Time Physical Health Resilience  ADL's:  Intact  Cognition: WNL  Sleep:      Treatment Plan Summary: Daily contact with patient to assess and evaluate symptoms and progress in treatment and Medication management  -Continue Ativan taper for benzo abuse.  -Start clonidine as needed for symptoms of opiate withdrawal.  -UDS ordered to evaluate for presence of UTI. Start pyridium tid prn for urinary burning.   Disposition: Recommend psychiatric Inpatient admission when medically cleared. Supportive therapy provided about ongoing stressors.  Mia Shiley, NP 02/28/2016 1:53 PM  Patient seen face to face for this psych evaluation and  case discussed with physician extender and treatment team. Developed treatment plan, reviewed the information documented and agree with the treatment plan.  Mia [name],JANARDHAHA R. 03/01/2016 8:57 AM

## 2016-02-28 NOTE — Plan of Care (Signed)
Problem: Consults Goal: Substance Abuse Patient Education See Patient Education Module for education specifics.  Outcome: Progressing Instructed patient on substance abuse information she states understanding CTownsendRN

## 2016-02-28 NOTE — BH Assessment (Signed)
[facility] been [facility].  Accepting physician is Dr. Toni Amendlapacs.  Attending Physician will be Dr. Jennet MaduroPucilowska.  Patient has been assigned to room 316-A, [facility] Charge Nurse Cliff P.  Call report to [phone].  Representative/Transfer Coordinator is Nicola Girtalvin  [facility] ER Staff Onalee Hua(David, TTS & Rene Kocheregina, Child psychotherapistocial Worker) made aware of acceptance.

## 2016-02-28 NOTE — ED Notes (Addendum)
[facility]  W/ [facility], belongings given to driver.

## 2016-02-28 NOTE — BH Assessment (Signed)
[facility] Assessment Progress Note  Patient meets inpatient criteria as appropriate bed placement is investigated.     

## 2016-02-28 NOTE — Progress Notes (Signed)
D:  Patient is a 40 year-old female admitted to [facility] ambulatory without difficulty.  Patient is alert and oriented upon admission. A:  Admission assessment completed without difficulty.  Skin and contraband assessment completed with no skin abnormalities nor contraband found.  Q.15 minute safety checks were implemented at the time of admission.  Patient was oriented to the unit and escorted to room #316. R:  Patient was receptive to and cooperative with admission assessment.  Patient contracts for safety on the unit at this time

## 2016-02-28 NOTE — Progress Notes (Signed)
D: Patient is alert and oriented on the unit this shift. Patient  Is a new patient and did not attend group as of yet.. Patient denies suicidal ideation, homicidal ideation, auditory or visual hallucinations at the present time.  A: Scheduled medications are administered to patient as per MD orders. Emotional support and encouragement are provided. Patient is maintained on q.15 minute safety checks. Patient is informed to notify staff with questions or concerns. R: No adverse medication reactions are noted. Patient is cooperative with medication administration and treatment plan today. Patient is receptive,  anxious and cooperative on the unit at this time. Patient did not interact with anyone on unit this evening this shift. Patient contracts for safety at this time. Patient remains safe at this time.

## 2016-02-28 NOTE — ED Notes (Signed)
Pt reports that she is not able to void at this time.

## 2016-02-29 DIAGNOSIS — F3162 Bipolar disorder, current episode mixed, moderate: Secondary | ICD-10-CM | POA: Diagnosis present

## 2016-02-29 MED ORDER — LAMOTRIGINE 25 MG PO TABS
25.0000 mg | ORAL_TABLET | Freq: Two times a day (BID) | ORAL | Status: DC
  Administered 2016-02-29 – 2016-03-01 (×3): 25 mg via ORAL
  Filled 2016-02-29 (×3): qty 1

## 2016-02-29 MED ORDER — ZIPRASIDONE HCL 20 MG PO CAPS
20.0000 mg | ORAL_CAPSULE | Freq: Two times a day (BID) | ORAL | Status: DC
  Administered 2016-02-29 – 2016-03-01 (×2): 20 mg via ORAL
  Filled 2016-02-29 (×2): qty 1

## 2016-02-29 NOTE — BHH Counselor (Signed)
Adult Comprehensive Assessment  Patient ID: Mia [name], female   DOB: Apr 29, 1976, 40 y.o.   MRN: [id number]  Information Source: Information source: Patient  Current Stressors:  Educational / Learning stressors: None reported  Employment / Job issues: Unemployed  Family Relationships: None reported  Surveyor, quantity / Lack of resources (include bankruptcy): No income  Housing / Lack of housing: Pt moves around a lot which is stressful for her.  Physical health (include injuries & life threatening diseases): None reported  Social relationships: None reported  Substance abuse: Pt reports trying to kill herself with Heroin but that was the first time she has used it. She denies using other substances but UDS was postive for benzodiazepines and cocaine.  Bereavement / Loss: None reported   Living/Environment/Situation:  Living Arrangements: Parent Living conditions (as described by patient or guardian): Pt live with mother at a hotel in [place].  How long has patient lived in current situation?: 1 week.  What is atmosphere in current home: Chaotic, Temporary  Family History:  Marital status: Single Are you sexually active?: Yes What is your sexual orientation?: Heterosexual  Has your sexual activity been affected by drugs, alcohol, medication, or emotional stress?: None reported  Does patient have children?: Yes How many children?: 1 How is patient's relationship with their children?: 96 year old daughter, good relationship but she lives with a friend.   Childhood History:  By whom was/is the patient raised?: Mother Description of patient's relationship with caregiver when they were a child: "ok" relationship with mother Patient's description of current relationship with people who raised him/her: "ok" relationship with mother.  How were you disciplined when you got in trouble as a child/adolescent?: None reported  Does patient have siblings?: No Did patient suffer any  verbal/emotional/physical/sexual abuse as a child?: No Did patient suffer from severe childhood neglect?: No Has patient ever been sexually abused/assaulted/raped as an adolescent or adult?: No Was the patient ever a victim of a crime or a disaster?: No Witnessed domestic violence?: No Has patient been effected by domestic violence as an adult?: No  Education:  Highest grade of school patient has completed: High school  Currently a student?: No Learning disability?: No  Employment/Work Situation:   Employment situation: Unemployed Patient's job has been impacted by current illness: No What is the longest time patient has a held a job?: 6 months  Where was the patient employed at that time?: retail  Has patient ever been in the [employer]?: No  Financial Resources:   Surveyor, quantity resources: Medicaid, No income Copy ) Does patient have a Lawyer or guardian?: No  Alcohol/Substance Abuse:   What has been your use of drugs/alcohol within the last 12 months?: Pt reports using heroin. When asked about other substances she states "not really". She was positive for cocaine and benzodiazepines.  If attempted suicide, did drugs/alcohol play a role in this?: Yes Alcohol/Substance Abuse Treatment Hx: Past detox, Past Tx, Outpatient, Past Tx, Inpatient If yes, describe treatment: Unable to state where.  Has alcohol/substance abuse ever caused legal problems?: No  Social Support System:   Forensic psychologist System: None Describe Community Support System: None  Type of faith/religion: NA  How does patient's faith help to cope with current illness?: NA   Leisure/Recreation:   Leisure and Hobbies: Unable to state   Strengths/Needs:   What things does the patient do well?: Unable to state  In what areas does patient struggle / problems for patient: no income, substance  abuse, lack of stable housing   Discharge Plan:   Does patient have access to  transportation?: Yes Will patient be returning to same living situation after discharge?: Yes Currently receiving community mental health services: No If no, would patient like referral for services when discharged?: Yes (What county?) Medical sales representative([place] ) Does patient have financial barriers related to discharge medications?: Yes Patient description of barriers related to discharge medications: Out of state medicaid   Summary/Recommendations:    Patient is a [age] year old female admitted  with a diagnosis of Substance induced mood disorder. Patient presented to the hospital with SI, depression and substance abuse. Patient reports primary triggers for admission were lack of stable housing. Patient will benefit from crisis stabilization, medication evaluation, group therapy and psycho education in addition to case management for discharge. At discharge, it is recommended that patient remain compliant with established discharge plan and continued treatment   Mia [name]. MSW, [facility]isCSWA  02/29/2016

## 2016-02-29 NOTE — BHH Group Notes (Signed)
BHH Group Notes:  (Nursing/MHT/Case Management/Adjunct)  Date:  02/29/2016  Time:  12:15 AM  Type of Therapy:  Group Therapy  Participation Level:  Did Not Attend   Summary of Progress/Problems:  Mia [name] 02/29/2016, 12:15 AM

## 2016-02-29 NOTE — H&P (Signed)
Psychiatric Admission Assessment Adult  Patient Identification: Mia [name] MRN:  [id number] Date of Evaluation:  02/29/2016 Chief Complaint:  depression Principal Diagnosis: <principal problem not specified> Diagnosis:   Patient Active Problem List   Diagnosis Date Noted  . Opiate addiction ([place]) [F11.20] 02/28/2016  . Benzodiazepine abuse [F13.10] 02/28/2016  . Substance induced mood disorder [facility]) [F19.94] 02/28/2016   History of Present Illness:: 40 year old woman transferred from [facility] emergency room. She presented there is saying that she was depressed and also requesting detox claiming that she had been using heroin. On evaluation today the patient says that her chief complaint as having bipolar disorder. She says for the last week her mood has been very bad. Down and also anxious. Feels like her thoughts are racing all the time. Feels tired all the time. Eating okay. Denies homicidal ideation but says that she was using drugs in an attempt to try to kill her self because she feels hopeless and upset with herself. She has not been compliant with her prescribed psychiatric medicine in a couple of months. She says to me also that she was snorting heroin and using cocaine. Her drug screen is not really consistent with that having marijuana but no opiates in it and having benzodiazepines. Patient indicates she's been under a lot of stress recently. She and her mother relocated from [place] up to [place] and were staying in a motel. Not really clear what she was doing up there. Associated Signs/Symptoms: Depression Symptoms:  depressed mood, anhedonia, psychomotor retardation, fatigue, feelings of worthlessness/guilt, difficulty concentrating, suicidal thoughts without plan, (Hypo) Manic Symptoms:  Distractibility, Anxiety Symptoms:  Excessive Worry, Psychotic Symptoms:  Hallucinations: Auditory PTSD Symptoms: Hypervigilance:  Yes Total Time spent with patient: 1  hour  Past Psychiatric History: Patient indicates that she's had hospitalizations in the past. Says that she's been diagnosed with bipolar disorder. She only recently moved to [place] from [place] where she had been for a long time. In [place] she had hospitalizations and was also seeing a doctor in the [place] area. She's been treated with Lamictal trazodone Klonopin and some other medicine that she can't remember. Denies serious suicide attempts in the past.  Is the patient at risk to self? Yes.    Has the patient been a risk to self in the past 6 months? Yes.    Has the patient been a risk to self within the distant past? Yes.    Is the patient a risk to others? Yes.    Has the patient been a risk to others in the past 6 months? No.  Has the patient been a risk to others within the distant past? No.   Prior Inpatient Therapy:  previous inpatient hospitalizations mostly in other states that the diagnosis of bipolar disorder Prior Outpatient Therapy:  has followed up with some outpatient treatment but not recently  Alcohol Screening: 1. How often do you have a drink containing alcohol?: Monthly or less 2. How many drinks containing alcohol do you have on a typical day when you are drinking?: 1 or 2 3. How often do you have six or more drinks on one occasion?: Never Preliminary Score: 0 4. How often during the last year have you found that you were not able to stop drinking once you had started?: Never 5. How often during the last year have you failed to do what was normally expected from you becasue of drinking?: Never 6. How often during the last year have you needed a  first drink in the morning to get yourself going after a heavy drinking session?: Never 7. How often during the last year have you had a feeling of guilt of remorse after drinking?: Never 8. How often during the last year have you been unable to remember what happened the night before because you had been drinking?:  Never 9. Have you or someone else been injured as a result of your drinking?: No 10. Has a relative or friend or a doctor or another health worker been concerned about your drinking or suggested you cut down?: No Alcohol Use Disorder Identification Test Final Score (AUDIT): 1 Brief Intervention: AUDIT score less than 7 or less-screening does not suggest unhealthy drinking-brief intervention not indicated Substance Abuse History in the last 12 months:  Yes.   Consequences of Substance Abuse: Worsening psychiatric symptoms and poor social function Previous Psychotropic Medications: Yes  Psychological Evaluations: Yes  Past Medical History:  Past Medical History  Diagnosis Date  . Anxiety   . Bipolar 1 disorder [facility])     Past Surgical History  Procedure Laterality Date  . Cesarean section     Family History: History reviewed. No pertinent family history. Family Psychiatric  History: Patient says her mother has mental health problems to possibly bipolar disorder Tobacco Screening: @FLOW [phone])::1)@ Social History:  History  Alcohol Use No     History  Drug Use No    Additional Social History:                           Allergies:  No Known Allergies Lab Results:  Results for orders placed or performed during the hospital encounter of 02/27/16 (from the past 48 hour(s))  Comprehensive metabolic panel     Status: None   Collection Time: 02/27/16  8:02 PM  Result Value Ref Range   Sodium 140 135 - 145 mmol/L   Potassium 4.1 3.5 - 5.1 mmol/L   Chloride 103 101 - 111 mmol/L   CO2 29 22 - 32 mmol/L   Glucose, Bld 91 65 - 99 mg/dL   BUN 17 6 - 20 mg/dL   Creatinine, Ser 0.69 0.44 - 1.00 mg/dL   Calcium 9.0 8.9 - 10.3 mg/dL   Total Protein 6.7 6.5 - 8.1 g/dL   Albumin 3.5 3.5 - 5.0 g/dL   AST 25 15 - 41 U/L   ALT 17 14 - 54 U/L   Alkaline Phosphatase 48 38 - 126 U/L   Total Bilirubin 0.8 0.3 - 1.2 mg/dL   GFR calc non Af Amer >60 >60 mL/min   GFR calc Af Amer  >60 >60 mL/min    Comment: (NOTE) The eGFR has been calculated using the CKD EPI equation. This calculation has not been validated in all clinical situations. eGFR's persistently <60 mL/min signify possible Chronic Kidney Disease.    Anion gap 8 5 - 15  Ethanol     Status: None   Collection Time: 02/27/16  8:02 PM  Result Value Ref Range   Alcohol, Ethyl (B) <5 <5 mg/dL    Comment:        LOWEST DETECTABLE LIMIT FOR SERUM ALCOHOL IS 5 mg/dL FOR MEDICAL PURPOSES ONLY   Salicylate level     Status: None   Collection Time: 02/27/16  8:02 PM  Result Value Ref Range   Salicylate Lvl <8.8 2.8 - 30.0 mg/dL  Acetaminophen level     Status: Abnormal   Collection Time: 02/27/16  8:02  PM  Result Value Ref Range   Acetaminophen (Tylenol), Serum <10 (L) 10 - 30 ug/mL    Comment:        THERAPEUTIC CONCENTRATIONS VARY SIGNIFICANTLY. A RANGE OF 10-30 ug/mL MAY BE AN EFFECTIVE CONCENTRATION FOR MANY PATIENTS. HOWEVER, SOME ARE BEST TREATED AT CONCENTRATIONS OUTSIDE THIS RANGE. ACETAMINOPHEN CONCENTRATIONS >150 ug/mL AT 4 HOURS AFTER INGESTION AND >50 ug/mL AT 12 HOURS AFTER INGESTION ARE OFTEN ASSOCIATED WITH TOXIC REACTIONS.   cbc     Status: None   Collection Time: 02/27/16  8:02 PM  Result Value Ref Range   WBC 6.9 4.0 - 10.5 K/uL   RBC 4.14 3.87 - 5.11 MIL/uL   Hemoglobin 12.9 12.0 - 15.0 g/dL   HCT 38.2 36.0 - 46.0 %   MCV 92.3 78.0 - 100.0 fL   MCH 31.2 26.0 - 34.0 pg   MCHC 33.8 30.0 - 36.0 g/dL   RDW 14.4 11.5 - 15.5 %   Platelets 256 150 - 400 K/uL  Rapid urine drug screen (hospital performed)     Status: Abnormal   Collection Time: 02/27/16  8:31 PM  Result Value Ref Range   Opiates NONE DETECTED NONE DETECTED   Cocaine POSITIVE (A) NONE DETECTED   Benzodiazepines POSITIVE (A) NONE DETECTED   Amphetamines NONE DETECTED NONE DETECTED   Tetrahydrocannabinol NONE DETECTED NONE DETECTED   Barbiturates NONE DETECTED NONE DETECTED    Comment:        DRUG SCREEN FOR  MEDICAL PURPOSES ONLY.  IF CONFIRMATION IS NEEDED FOR ANY PURPOSE, NOTIFY LAB WITHIN 5 DAYS.        LOWEST DETECTABLE LIMITS FOR URINE DRUG SCREEN Drug Class       Cutoff (ng/mL) Amphetamine      1000 Barbiturate      200 Benzodiazepine   425 Tricyclics       956 Opiates          300 Cocaine          300 THC              50   Pregnancy, urine     Status: None   Collection Time: 02/27/16  8:31 PM  Result Value Ref Range   Preg Test, Ur NEGATIVE NEGATIVE    Comment:        THE SENSITIVITY OF THIS METHODOLOGY IS >20 mIU/mL.   Urinalysis, Routine w reflex microscopic (not at [facility])     Status: Abnormal   Collection Time: 02/28/16  3:36 PM  Result Value Ref Range   Color, Urine YELLOW YELLOW   APPearance TURBID (A) CLEAR   Specific Gravity, Urine 1.023 1.005 - 1.030   pH 6.5 5.0 - 8.0   Glucose, UA NEGATIVE NEGATIVE mg/dL   Hgb urine dipstick MODERATE (A) NEGATIVE   Bilirubin Urine NEGATIVE NEGATIVE   Ketones, ur NEGATIVE NEGATIVE mg/dL   Protein, ur NEGATIVE NEGATIVE mg/dL   Nitrite POSITIVE (A) NEGATIVE   Leukocytes, UA LARGE (A) NEGATIVE  Urine microscopic-add on     Status: Abnormal   Collection Time: 02/28/16  3:36 PM  Result Value Ref Range   Squamous Epithelial / LPF 6-30 (A) NONE SEEN   WBC, UA TOO NUMEROUS TO COUNT 0 - 5 WBC/hpf   RBC / HPF TOO NUMEROUS TO COUNT 0 - 5 RBC/hpf   Bacteria, UA MANY (A) NONE SEEN   Urine-Other MUCOUS PRESENT     Blood Alcohol level:  Lab Results  Component Value Date   ETH <  5 67/20/9470    Metabolic Disorder Labs:  No results found for: HGBA1C, MPG No results found for: PROLACTIN No results found for: CHOL, TRIG, HDL, CHOLHDL, VLDL, LDLCALC  Current Medications: Current Facility-Administered Medications  Medication Dose Route Frequency Provider Last Rate Last Dose  . acetaminophen (TYLENOL) tablet 650 mg  650 mg Oral Q6H PRN Gonzella Lex, MD      . alum & mag hydroxide-simeth (MAALOX/MYLANTA) 200-200-20 MG/5ML  suspension 30 mL  30 mL Oral Q4H PRN Gonzella Lex, MD      . lamoTRIgine (LAMICTAL) tablet 25 mg  25 mg Oral BID Gonzella Lex, MD      . magnesium hydroxide (MILK OF MAGNESIA) suspension 30 mL  30 mL Oral Daily PRN Gonzella Lex, MD      . sulfamethoxazole-trimethoprim (BACTRIM DS,SEPTRA DS) 800-160 MG per tablet 1 tablet  1 tablet Oral Q12H Gonzella Lex, MD   1 tablet at 02/29/16 0859  . traZODone (DESYREL) tablet 100 mg  100 mg Oral QHS PRN Gonzella Lex, MD   100 mg at 02/28/16 2234  . ziprasidone (GEODON) capsule 20 mg  20 mg Oral BID WC Gonzella Lex, MD       PTA Medications: No prescriptions prior to admission    Musculoskeletal: Strength & Muscle Tone: within normal limits Gait & Station: normal Patient leans: N/A  Psychiatric Specialty Exam: Physical Exam  Nursing note and vitals reviewed. Constitutional: She appears well-developed and well-nourished.  HENT:  Head: Normocephalic and atraumatic.  Eyes: Conjunctivae are normal. Pupils are equal, round, and reactive to light.  Neck: Normal range of motion.  Cardiovascular: Normal heart sounds.   Respiratory: Effort normal.  GI: Soft.  Musculoskeletal: Normal range of motion.  Neurological: She is alert.  Skin: Skin is warm and dry.  Psychiatric: Her mood appears anxious. Her affect is blunt. Her speech is delayed. She is slowed. Thought content is paranoid. She expresses inappropriate judgment. She exhibits abnormal recent memory.    Review of Systems  HENT: Negative.   Eyes: Negative.   Respiratory: Negative.   Cardiovascular: Negative.   Gastrointestinal: Positive for abdominal pain.  Musculoskeletal: Negative.   Skin: Negative.   Neurological: Positive for weakness.  Psychiatric/Behavioral: Positive for depression, suicidal ideas, hallucinations and memory loss. The patient has insomnia.     Blood pressure 108/63, pulse 88, temperature 98.6 F (37 C), temperature source Oral, resp. rate 18, height 5' 4"   (1.626 m), weight 76.658 kg (169 lb), last menstrual period 12/14/2015, SpO2 99 %.Body mass index is 28.99 kg/(m^2).  General Appearance: Disheveled  Eye Contact::  Minimal  Speech:  Garbled and Slow  Volume:  Decreased  Mood:  Anxious  Affect:  Depressed  Thought Process:  Tangential  Orientation:  Full (Time, Place, and Person)  Thought Content:  Hallucinations: Auditory  Suicidal Thoughts:  Yes.  without intent/plan  Homicidal Thoughts:  No  Memory:  Immediate;   Fair Recent;   Fair Remote;   Fair  Judgement:  Fair  Insight:  Fair  Psychomotor Activity:  Decreased  Concentration:  Fair  Recall:  [employer] of Knowledge:Fair  Language: Fair  Akathisia:  No  Handed:  Right  AIMS (if indicated):     Assets:  Desire for Improvement Physical Health Resilience  ADL's:  Intact  Cognition: Impaired,  Mild  Sleep:  Number of Hours: 8     Treatment Plan Summary: Daily contact with patient to assess and evaluate symptoms  and progress in treatment, Medication management and Plan Patient admitted to the psychiatry ward. Continuous observation. I am going to restart her Lamictal 25 twice a day and also put her on Geodon 20 mg twice a day with meals for what appears to be psychosis and mood instability. She will need to be engaged in substance abuse treatment. Team can work on possibly getting some collateral information to make sure she has appropriate discharge in place.  Observation Level/Precautions:  Continuous Observation  Laboratory:  UDS  Psychotherapy:  Daily individual and group psychotherapy   Medications:  Medicines as noted above for bipolar disorder   Consultations:  None at this time   Discharge Concerns:  Appropriate living situation and outpatient treatment   Estimated LOS:3-4 days   Other:     I certify that inpatient services furnished can reasonably be expected to improve the patient's condition.    Alethia Berthold, MD 5/14/20172:13 PM

## 2016-02-29 NOTE — BHH Suicide Risk Assessment (Signed)
[facility]   Nursing information obtained from:    Demographic factors:    Current Mental Status:    Loss Factors:    Historical Factors:    Risk Reduction Factors:     Total Time spent with patient: 1 hour Principal Problem: <principal problem not specified> Diagnosis:   Patient Active Problem List   Diagnosis Date Noted  . Opiate addiction ([facility]) [F11.20] 02/28/2016  . Benzodiazepine abuse [F13.10] 02/28/2016  . Substance induced mood disorder [facility]) [F19.94] 02/28/2016   Subjective Data: [facility] with a [facility] Abusing multiple drugs. Mood depressed. Passive suicidal thoughts. Noncompliant with medicine.  Continued Clinical Symptoms:  Alcohol Use Disorder Identification Test Final Score (AUDIT): 1 The "Alcohol Use Disorders Identification Test", Guidelines for Use in Primary Care, Second Edition.  World Science writerHealth Organization [facility](WHO). Score between 0-7:  no or low risk or alcohol related problems. Score between 8-15:  moderate risk of alcohol related problems. Score between 16-19:  high risk of alcohol related problems. Score 20 or above:  warrants further diagnostic evaluation for alcohol dependence and treatment.   CLINICAL FACTORS:   Bipolar Disorder:   Mixed State Alcohol/Substance Abuse/Dependencies   Musculoskeletal: Strength & Muscle Tone: within normal limits Gait & Station: normal Patient leans: N/A  Psychiatric Specialty Exam: ROS  Blood pressure 108/63, pulse 88, temperature 98.6 F (37 C), temperature source Oral, resp. rate 18, height 5\' 4"  (1.626 m), weight 76.658 kg (169 lb), last menstrual period 12/14/2015, SpO2 99 %.Body mass index is 28.99 kg/(m^2).  General Appearance: Disheveled  Eye Contact::  Minimal  Speech:  Slow  Volume:  Decreased  Mood:  Dysphoric  Affect:  Blunt  Thought Process:  Tangential  Orientation:  Full (Time, Place, and Person)  Thought Content:   Hallucinations: Auditory  Suicidal Thoughts:  Yes.  without intent/plan  Homicidal Thoughts:  No  Memory:  Immediate;   Fair Recent;   Fair Remote;   Fair  Judgement:  Fair  Insight:  Fair  Psychomotor Activity:  Decreased  Concentration:  Fair  Recall:  [employer]  Fund of Knowledge:Fair  Language: Fair  Akathisia:  No  Handed:  Right  AIMS (if indicated):     Assets:  Desire for Improvement Physical Health Resilience  Sleep:  Number of Hours: 8  Cognition: Impaired,  Mild  ADL's:  Intact    COGNITIVE FEATURES THAT CONTRIBUTE TO RISK:  Loss of executive function    SUICIDE RISK:   Mild:  Suicidal ideation of limited frequency, intensity, duration, and specificity.  There are no identifiable plans, no associated intent, mild dysphoria and related symptoms, good self-control (both objective and subjective assessment), few other risk factors, and identifiable protective factors, including available and accessible social support.  PLAN OF CARE: Continuous observation for now. Restart medicine for bipolar disorder psychotic and depressed. Monitor for any withdrawal symptoms. Engage in groups and work on discharge planning  I certify that inpatient services furnished can reasonably be expected to improve the patient's condition.   Mordecai RasmussenJohn Rayden Dock, MD 02/29/2016, 2:20 PM

## 2016-02-29 NOTE — BHH Group Notes (Signed)
BHH Group Notes:  (Nursing/MHT/Case Management/Adjunct)  Date:  02/29/2016  Time:  10:23 AM  Type of Therapy:  goal setting   Participation Level:  Did Not Attend  Twanna Hymanda C Marquis Down 02/29/2016, 10:23 AM

## 2016-02-29 NOTE — Plan of Care (Signed)
Problem: Ineffective individual coping Goal: STG: Patient will remain free from self harm Outcome: Progressing No self harm reported or observed     

## 2016-02-29 NOTE — Progress Notes (Signed)
Pt reports feeling really sleepy, did not eat breakfast, states not hungry. Pt in with doctor at present. Denies SI at this time. Encouragement and support offered. Pt receptive and remains safe on unit with q 15 min checks.

## 2016-02-29 NOTE — BHH Group Notes (Signed)
BHH LCSW Group Therapy  02/29/2016 2:05 PM  Type of Therapy:  Group Therapy  Participation Level:  Did Not Attend  Modes of Intervention:  Discussion, Education, Socialization and Support  Summary of Progress/Problems: Pt will identify unhealthy thoughts and how they impact their emotions and behavior. Pt will be encouraged to discuss these thoughts, emotions and behaviors with the group.   Obinna Ehresman L Sakeenah Valcarcel MSW, LCSWA  02/29/2016, 2:05 PM   

## 2016-03-01 ENCOUNTER — Encounter: Payer: Self-pay | Admitting: Psychiatry

## 2016-03-01 DIAGNOSIS — N390 Urinary tract infection, site not specified: Secondary | ICD-10-CM

## 2016-03-01 DIAGNOSIS — R45851 Suicidal ideations: Secondary | ICD-10-CM

## 2016-03-01 DIAGNOSIS — F3162 Bipolar disorder, current episode mixed, moderate: Secondary | ICD-10-CM

## 2016-03-01 HISTORY — DX: Suicidal ideations: R45.851

## 2016-03-01 HISTORY — DX: Urinary tract infection, site not specified: N39.0

## 2016-03-01 MED ORDER — LAMOTRIGINE 100 MG PO TABS
200.0000 mg | ORAL_TABLET | Freq: Every day | ORAL | Status: DC
  Administered 2016-03-01 – 2016-03-02 (×2): 200 mg via ORAL
  Filled 2016-03-01 (×3): qty 2

## 2016-03-01 MED ORDER — ZIPRASIDONE HCL 20 MG PO CAPS
60.0000 mg | ORAL_CAPSULE | Freq: Two times a day (BID) | ORAL | Status: DC
  Administered 2016-03-01 – 2016-03-03 (×4): 60 mg via ORAL
  Filled 2016-03-01 (×4): qty 3

## 2016-03-01 MED ORDER — CLONAZEPAM 1 MG PO TABS
1.0000 mg | ORAL_TABLET | Freq: Two times a day (BID) | ORAL | Status: DC
  Administered 2016-03-01 – 2016-03-03 (×5): 1 mg via ORAL
  Filled 2016-03-01 (×5): qty 1

## 2016-03-01 NOTE — Plan of Care (Signed)
Problem: Alteration in mood & ability to function due to Goal: LTG-Pt reports reduction in suicidal thoughts (Patient reports reduction in suicidal thoughts and is able to verbalize a safety plan for whenever patient is feeling suicidal)  Outcome: Progressing Patient is reporting no SI at this time [facility]

## 2016-03-01 NOTE — Progress Notes (Signed)
Pleasant and cooperative with care. Patient anxious this shift. Scheduled medications given. Pt did attend some group and was more visible in the mileu. Encouragement and support offered. Pt receptive and remains safe on unit with q 15 min checks.

## 2016-03-01 NOTE — Progress Notes (Signed)
[facility] MD Progress Note  03/01/2016 3:26 PM Mia [name]  MRN:  [id number]  Subjective:  [name] still complains of depression,anxiety,and passing suicidal thoughts. She is disorganized in her thinking and unable to answer any questions with decision. She reports that her doctor from [place] called in prescription to her local pharmacy. She is on a combination of Geodon, Lamictal, and clonazepam. She reports that she's been compliant with medications up until now. She does not realize that her [place] will not pay for the prescriptions in [place]. She has no somatic complaints. She denies any substance use or alcohol use. She does not desire substance abuse treatment. She was positive for cocaine on admission but does not consider it a problem.  Principal Problem: Bipolar 1 disorder, mixed, moderate (HCC) Diagnosis:   Patient Active Problem List   Diagnosis Date Noted  . Suicidal ideation [R45.851] 03/01/2016  . UTI (urinary tract infection) [N39.0] 03/01/2016  . Bipolar 1 disorder, mixed, moderate (HCC) [F31.62]   . Opioid use disorder, moderate, dependence (HCC) [F11.20] 02/28/2016  . Sedative, hypnotic or anxiolytic use disorder, severe, dependence (HCC) [F13.20] 02/28/2016  . Substance induced mood disorder (HCC) [F19.94] 02/28/2016   Total Time spent with patient: 20 minutes  Past Psychiatric History:bipolar disorder.ast Medical History:  Past Medical History  Diagnosis Date  . Anxiety   . Bipolar 1 disorder [facility])     Past Surgical History  Procedure Laterality Date  . Cesarean section     Family History: History reviewed. No pertinent family history. Family Psychiatric  History: Bipolar disorder.  Social History:  History  Alcohol Use No     History  Drug Use No    Social History   Social History  . Marital Status: Single    Spouse Name: N/A  . Number of Children: N/A  . Years of Education: N/A   Social History Main Topics  . Smoking  status: Current Some Day Smoker -- 1.00 packs/day    Types: Cigarettes  . Smokeless tobacco: Never Used  . Alcohol Use: No  . Drug Use: No  . Sexual Activity: Not Asked   Other Topics Concern  . None   Social History Narrative   Additional Social History:                         Sleep: Fair  Appetite:  Fair  Current Medications: Current Facility-Administered Medications  Medication Dose Route Frequency Provider Last Rate Last Dose  . acetaminophen (TYLENOL) tablet 650 mg  650 mg Oral Q6H PRN Audery Amel, MD      . alum & mag hydroxide-simeth (MAALOX/MYLANTA) 200-200-20 MG/5ML suspension 30 mL  30 mL Oral Q4H PRN Audery Amel, MD      . clonazePAM (KLONOPIN) tablet 1 mg  1 mg Oral BID Makinsley Schiavi B Lonzy Mato, MD      . lamoTRIgine (LAMICTAL) tablet 200 mg  200 mg Oral QHS Roxana Lai B Masashi Snowdon, MD      . magnesium hydroxide (MILK OF MAGNESIA) suspension 30 mL  30 mL Oral Daily PRN Audery Amel, MD      . sulfamethoxazole-trimethoprim (BACTRIM DS,SEPTRA DS) 800-160 MG per tablet 1 tablet  1 tablet Oral Q12H Audery Amel, MD   1 tablet at 03/01/16 0819  . traZODone (DESYREL) tablet 100 mg  100 mg Oral QHS PRN Audery Amel, MD   100 mg at 02/28/16 2234  . ziprasidone (GEODON) capsule 60 mg  60 mg Oral BID WC Shari ProwsJolanta B Sherina Stammer, MD        Lab Results:  Results for orders placed or performed during the hospital encounter of 02/27/16 (from the past 48 hour(s))  Urinalysis, Routine w reflex microscopic (not [facility])     Status: Abnormal   Collection Time: 02/28/16  3:36 PM  Result Value Ref Range   Color, Urine YELLOW YELLOW   APPearance TURBID (A) CLEAR   Specific Gravity, Urine 1.023 1.005 - 1.030   pH 6.5 5.0 - 8.0   Glucose, UA NEGATIVE NEGATIVE mg/dL   Hgb urine dipstick MODERATE (A) NEGATIVE   Bilirubin Urine NEGATIVE NEGATIVE   Ketones, ur NEGATIVE NEGATIVE mg/dL   Protein, ur NEGATIVE NEGATIVE mg/dL   Nitrite POSITIVE (A) NEGATIVE   Leukocytes, UA  LARGE (A) NEGATIVE  Urine microscopic-add on     Status: Abnormal   Collection Time: 02/28/16  3:36 PM  Result Value Ref Range   Squamous Epithelial / LPF 6-30 (A) NONE SEEN   WBC, UA TOO NUMEROUS TO COUNT 0 - 5 WBC/hpf   RBC / HPF TOO NUMEROUS TO COUNT 0 - 5 RBC/hpf   Bacteria, UA MANY (A) NONE SEEN   Urine-Other MUCOUS PRESENT     Blood Alcohol level:  Lab Results  Component Value Date   ETH <5 02/27/2016    Physical Findings: AIMS: Facial and Oral Movements Muscles of Facial Expression: None, normal Lips and Perioral Area: None, normal Jaw: None, normal Tongue: None, normal,Extremity Movements Upper (arms, wrists, hands, fingers): None, normal, Trunk Movements Neck, shoulders, hips: None, normal, Overall Severity Severity of abnormal movements (highest score from questions above): None, normal Incapacitation due to abnormal movements: None, normal Patient's awareness of abnormal movements (rate only patient's report): No Awareness, Dental Status Current problems with teeth and/or dentures?: No Does patient usually wear dentures?: No  CIWA:  CIWA-Ar Total: 2 COWS:     Musculoskeletal: Strength & Muscle Tone: within normal limits Gait & Station: normal Patient leans: N/A  Psychiatric Specialty Exam: Review of Systems  Psychiatric/Behavioral: Positive for depression. The patient is nervous/anxious and has insomnia.   All other systems reviewed and are negative.   Blood pressure 103/69, pulse 72, temperature 98.5 F (36.9 C), temperature source Oral, resp. rate 18, height 5\' 4"  (1.626 m), weight 76.658 kg (169 lb), last menstrual period 12/14/2015, SpO2 99 %.Body mass index is 28.99 kg/(m^2).  General Appearance: Fairly Groomed  Patent attorneyye Contact::  Good  Speech:  Clear and Coherent  Volume:  Normal  Mood:  Anxious and Depressed  Affect:  Labile and Tearful  Thought Process:  Disorganized  Orientation:  Full (Time, Place, and Person)  Thought Content:  WDL  Suicidal  Thoughts:  Yes.  with intent/plan  Homicidal Thoughts:  No  Memory:  Immediate;   Poor Recent;   Poor Remote;   Poor  Judgement:  Poor  Insight:  Lacking  Psychomotor Activity:  Normal  Concentration:  Poor  Recall:  Poor  Fund of Knowledge:Poor  Language: Poor  Akathisia:  No  Handed:  Right  AIMS (if indicated):     Assets:  Communication Skills Desire for Improvement Physical Health Resilience Social Support  ADL's:  Intact  Cognition: WNL  Sleep:  Number of Hours: 7.15   Treatment Plan Summary: Daily contact with patient to assess and evaluate symptoms and progress in treatment and Medication management   [name] is a 40 year old female with history of bipolar disorder admitted for worsening  of depression and psychotic disorganization in the context of severe social stressors   1. Suicidal ideation. The patient is able to contract for safety in the hospital.  2. Mood. She is continued on Geodon by increasing the dose to 60 mg twice daily and Lamictal for mood stabilization psychotic disorganization.  3 Anxiety. We will continue clonazepam 1 mg twice daily as the community.  4. Insomnia. She is on trazodone.   5. Substance abuse. She minimizes her problems and declines treatment.  6. Disposition. She will be discharged with her mother. She will follow-up with [facility] in [place].   5.  UTI. She is on Septra.  Kristine Linea, MD 03/01/2016, 3:26 PM

## 2016-03-01 NOTE — Plan of Care (Signed)
Problem: Consults Goal[facility] Patient Education Outcome: Progressing Patient verbalizes understanding of patient education on treatment plan at this time [place] RN

## 2016-03-01 NOTE — Progress Notes (Signed)
Recreation Therapy Notes  Date: 05.15.17 Time: 1:00 pm Location: Craft Room  Group Topic: Wellness  Goal Area(s) Addresses:  Patient will identify at least one item per dimension of health. Patient will examine areas they are deficient in.  Behavioral Response: Attentive  Intervention: 6 Dimensions of Health  Activity: Patients were given a definition sheet of the 6 dimensions of health. Patients were given a worksheet with the dimensions of health and instructed to write 2-3 things per each category.  Education: LRT educated patients on how they can improve each dimension of health.  Education Outcome: In group clarification offered   Clinical Observations/Feedback: Patient completed activity by writing at least 1 item in each category. Patient did not contribute to group discussion.  Jacquelynn CreeGreene,Tharon Bomar M, LRT/CTRS 03/01/2016 3:48 PM

## 2016-03-01 NOTE — Plan of Care (Signed)
Problem: Ineffective individual coping Goal: LTG: Patient will report a decrease in negative feelings Outcome: Progressing Patient is reporting a decrease in negative feelings to the nurse this shift [place] RN

## 2016-03-01 NOTE — Progress Notes (Signed)
D: Patient is alert and oriented on the unit this shift. Patient attends some but not all groups and actively participated in groups today. Patient denies suicidal ideation, homicidal ideation, auditory or visual hallucinations at the present time.  A: Scheduled medications are administered to patient as per MD orders. Emotional support and encouragement are provided. Patient is maintained on q.15 minute safety checks. Patient is informed to notify staff with questions or concerns. R: No adverse medication reactions are noted. Patient is cooperative with medication administration and treatment plan today. Patient is receptive,  anxious and cooperative on the unit at this time. Patient has minimal interactions with others on unit   this shift. Patient contracts for safety at this time. Patient remains safe at this time.

## 2016-03-01 NOTE — Progress Notes (Signed)
Recreation Therapy Notes  INPATIENT RECREATION THERAPY ASSESSMENT  Patient Details Name: Paula Comptonabitha A Chevalier MRN: [id number] DOB: 11/16/1975 Today's Date: 03/01/2016  Patient Stressors: Relationship, Friends, Other (Comment) (Recent break-up; lack of supportive friends; been moving with mom and staying in hotels; doesn't like the lady her daughter is living with because she says things that aren't true.)  Coping Skills:   Isolate, Substance Abuse, Avoidance, Exercise, Art/Dance, Talking, Music, Sports  Personal Challenges: Anger, Communication, Concentration, Decision-Making, Expressing Yourself, Problem-Solving, Relationships, Self-Esteem/Confidence, Social Interaction, Stress Management, Time Management, Trusting Others  Leisure Interests (2+):  Sports - Swimming, Individual - Other (Comment) (Eating)  Awareness of Community Resources:  No  Community Resources:     Current Use:    If no, Barriers?:    Patient Strengths:  "No, not really"  Patient Identified Areas of Improvement:  A lot  Current Recreation Participation:  Going out to eat and hanging out with her child  Patient Goal for Hospitalization:  To get better  [place]  [place]   Current [place] (including self-harm):  Yes ("Sometimes")  Current HI:  No  Consent to Intern Participation: N/A   Jacquelynn CreeGreene,Brien Lowe M, LRT/CTRS 03/01/2016, 12:21 PM

## 2016-03-01 NOTE — Progress Notes (Signed)
D: Pt affect anxious. Denies SI/HI/AVH. Denies pain. Appropriate with staff and peers. Compliant with evening medications. Denies pain.  A: Encouragement and support provided. Medications given as prescribed. Q15 minute checks maintained for safety. R: Remains safe on unit. Receptive to interventions. Voices no additional concerns at this time.

## 2016-03-01 NOTE — Progress Notes (Signed)
Patient plans to be discharged, no longer request smoking cessation medication.

## 2016-03-01 NOTE — Plan of Care (Signed)
Problem: Ineffective individual coping Goal: STG: Patient will remain free from self harm Outcome: Progressing No self harm reported or observed     

## 2016-03-01 NOTE — [facility] Group Notes (Signed)
[facility] Group Notes:  (Nursing/MHT/Case Management/Adjunct)  Date:  03/01/2016  Time:  4:11 PM  Type of Therapy:  Psychoeducational Skills  Participation Level:  Did Not Attend]  Mia [name] 03/01/2016, 4:11 PM

## 2016-03-02 NOTE — Progress Notes (Signed)
[facility]  03/02/2016 1:25 PM Mia [name]  MRN:  [id number]  Subjective:  Mia [name] appeared severely depressed yesterday but somewhat hypomanic today. She is too happy for the situation. She was continued on her medications of Lamictal and Geodon. Geodon dose was increased yesterday and the patient seems to tolerate it well. She slept well last night. She denies any somatic problems. She participates well in groups.  Mia [name] came to treatment team meeting today and was able to participate in discharge planning. She spoke with her mother. They still have her room at the hotel and the patient will be joining her mother. Both the patient and her mother have bipolar illness and they both [place]. This will admit that [facility] services and medications in the community.  Principal Problem: Bipolar 1 disorder, mixed, moderate (HCC) Diagnosis:   Patient Active Problem List   Diagnosis Date Noted  . Suicidal ideation [R45.851] 03/01/2016  . UTI (urinary tract infection) [N39.0] 03/01/2016  . Bipolar 1 disorder, mixed, moderate (HCC) [F31.62]   . Opioid use disorder, moderate, dependence (HCC) [F11.20] 02/28/2016  . Sedative, hypnotic or anxiolytic use disorder, severe, dependence (HCC) [F13.20] 02/28/2016  . Substance induced mood disorder (HCC) [F19.94] 02/28/2016   Total Time spent with patient: 20 minutes  Past Psychiatric History: Bipolar disorder.  Past Medical History:  Past Medical History  Diagnosis Date  . Anxiety   . Bipolar 1 disorder [facility](HCC)     Past Surgical History  Procedure Laterality Date  . Cesarean section     Family History: History reviewed. No pertinent family history. Family Psychiatric  History: Bipolar disorder. Social History:  History  Alcohol Use No     History  Drug Use No    Social History   Social History  . Marital Status: Single    Spouse Name: N/A  . Number of Children: N/A  . Years of Education: N/A    Social History Main Topics  . Smoking status: Current Some Day Smoker -- 1.00 packs/day    Types: Cigarettes  . Smokeless tobacco: Never Used  . Alcohol Use: No  . Drug Use: No  . Sexual Activity: Not Asked   Other Topics Concern  . None   Social History Narrative   Additional Social History:                         Sleep: Fair  Appetite:  Fair  Current Medications: Current Facility-Administered Medications  Medication Dose Route Frequency Provider Last Rate Last Dose  . acetaminophen (TYLENOL) tablet 650 mg  650 mg Oral Q6H PRN Audery AmelJohn T Clapacs, MD      . alum & mag hydroxide-simeth (MAALOX/MYLANTA) 200-200-20 MG/5ML suspension 30 mL  30 mL Oral Q4H PRN Audery AmelJohn T Clapacs, MD      . clonazePAM (KLONOPIN) tablet 1 mg  1 mg Oral BID Karlton Maya B Bernisha Verma, MD   1 mg at 03/02/16 1028  . lamoTRIgine (LAMICTAL) tablet 200 mg  200 mg Oral QHS Shari ProwsJolanta B Dorri Ozturk, MD   200 mg at 03/01/16 2203  . magnesium hydroxide (MILK OF MAGNESIA) suspension 30 mL  30 mL Oral Daily PRN Audery AmelJohn T Clapacs, MD      . sulfamethoxazole-trimethoprim (BACTRIM DS,SEPTRA DS) 800-160 MG per tablet 1 tablet  1 tablet Oral Q12H Audery AmelJohn T Clapacs, MD   1 tablet at 03/02/16 1029  . traZODone (DESYREL) tablet 100 mg  100 mg Oral QHS PRN  Audery Amel, MD   100 mg at 03/01/16 2205  . ziprasidone (GEODON) capsule 60 mg  60 mg Oral BID WC Tammatha Cobb B Lacey Wallman, MD   60 mg at 03/02/16 0631    Lab Results: No results found for this or any previous visit (from the past 48 hour(s)).  Blood Alcohol level:  Lab Results  Component Value Date   ETH <5 02/27/2016    Physical Findings: AIMS: Facial and Oral Movements Muscles of Facial Expression: None, normal Lips and Perioral Area: None, normal Jaw: None, normal Tongue: None, normal,Extremity Movements Upper (arms, wrists, hands, fingers): None, normal Lower (legs, knees, ankles, toes): None, normal, Trunk Movements Neck, shoulders, hips: None, normal, Overall  Severity Severity of abnormal movements (highest score from questions above): None, normal Incapacitation due to abnormal movements: None, normal Patient's awareness of abnormal movements (rate only patient's report): No Awareness, Dental Status Current problems with teeth and/or dentures?: No Does patient usually wear dentures?: No  CIWA:  CIWA-Ar Total: 2 COWS:     Musculoskeletal: Strength & Muscle Tone: within normal limits Gait & Station: normal Patient leans: N/A  Psychiatric Specialty Exam: Review of Systems  Psychiatric/Behavioral: Positive for depression.  All other systems reviewed and are negative.   Blood pressure 118/72, pulse 72, temperature 98.7 F (37.1 C), temperature source Oral, resp. rate 18, height  (1.626 m), weight 76.658 kg (169 lb), last menstrual period 12/14/2015, SpO2 99 %.Body mass index is 28.99 kg/(m^2).  General Appearance: Fairly Groomed  Patent attorney::  Good  Speech:  Pressured  Volume:  Normal  Mood:  Euphoric  Affect:  Appropriate  Thought Process:  Goal Directed  Orientation:  Full (Time, Place, and Person)  Thought Content:  WDL  Suicidal Thoughts:  No  Homicidal Thoughts:  No  Memory:  Immediate;   Fair Recent;   Fair Remote;   Fair  Judgement:  Poor  Insight:  Shallow  Psychomotor Activity:  Normal  Concentration:  Fair  Recall:  [employer] of Knowledge:Fair  Language: Fair  Akathisia:  No  Handed:  Right  AIMS (if indicated):     Assets:  Communication Skills Desire for Improvement Physical Health Resilience Social Support  ADL's:  Intact  Cognition: WNL  Sleep:  Number of Hours: 7.15   Treatment Plan Summary: Daily contact with patient to assess and evaluate symptoms and progress in treatment and Medication management   Mia [name] is a 40 year old female with history of bipolar disorder admitted for worsening of depression and psychotic disorganization in the context of severe social stressors   1. Suicidal  ideation. The patient is able to contract for safety in the hospital.  2. Mood. She is continued on Geodon but at  increased dose of 60 mg twice daily and Lamictal for mood stabilization psychotic disorganization.  3 Anxiety. We will continue clonazepam 1 mg twice daily as the community.  4. Insomnia. She is on trazodone.   5. Substance abuse. She minimizes her problems and declines treatment.  6. UTI. She is on Septra.  7. Disposition. She will be discharged with her mother. She will follow-up with [facility] in [place].    Kristine Linea, MD 03/02/2016, 1:25 PM

## 2016-03-02 NOTE — [facility] Group Notes (Signed)
[facility] LCSW Group Therapy  03/02/2016 8:50 AM  Type of Therapy:  Group Therapy  Participation Level:  Active  Participation Quality:  Appropriate and Attentive  Affect:  Appropriate  Cognitive:  Alert, Appropriate and Oriented  Insight:  Engaged  Engagement in Therapy:  Engaged  Modes of Intervention:  Discussion, Socialization and Support  Summary of Progress/Problems: Patient attended and participated appropriately during group introducing herself and sharing during an introductory exercise that if she were an animal, she would be "a dolphin.Marland Kitchen.Marland Kitchen.I'm from [place]". Patient was attentive throughout group discussion.   Lulu RidingIngle, Clairessa Boulet T, MSW, LCSW 03/02/2016, 8:50 AM
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
## 2016-03-02 NOTE — Progress Notes (Signed)
Pleasant and cooperative. Denies SI/HI/AVH.  Medication and group compliant.  Support and encouragement offered.  Safety maintained.  

## 2016-03-02 NOTE — Progress Notes (Signed)
Recreation Therapy Notes  Date: 05.16.17 Time: 3:00 pm Location: Craft Room  Group Topic: Self-expression  Goal Area(s) Addresses:  Patient will be able to identify a color that represents each emotion. Patient will verbalize benefit of using art as a means of self-expression. Patient will verbalize one emotion experienced while participating in activity.  Behavioral Response: Attentive, Interactive  Intervention: The Colors Within Me  Activity: Patients were given a blank face worksheet and instructed to pick a color for each emotion they were experiencing and show on the face how much of that emotion they were experiencing.  Education: LRT educated patients on other forms of self-expression.  Education Outcome: Acknowledges education/In group clarification offered  Clinical Observations/Feedback: Patient completed activity by picking a color for each emotion she was feeling drawing a face and showing how much of that emotion she was feeling on the worksheet. Patient contributed to group discussion by stating her emotions are dynamic.  Jacquelynn CreeGreene,Maylie Ashton M, LRT/CTRS 03/02/2016 4:59 PM

## 2016-03-02 NOTE — [facility] Group Notes (Addendum)
[facility]   03/02/2016 10:50 AM  Participation Quality:  Patient attended and participated in group introducing herself and sharing her SMART goal is "sobriety". Patient received a daily workbook for Tuesday.  Mood/Affect:  Appropriate  Depression Rating:  5  Anxiety Rating:  10  Thoughts of Suicide:  No Will you contract for safety?   Yes  Current AVH:  No  Plan for Discharge/Comments:  Return home and follow up with her outpatient provider  Transportation Means: mom will pick up  Supports: limited support but has friends and family  Mia [name], Mia [name], MSW, LCSW

## 2016-03-02 NOTE — Plan of Care (Signed)
Problem: [facility] Participation in Recreation Therapeutic Interventions Goal: STG-Patient will demonstrate improved self esteem by identif STG: Self-Esteem - Within 4 treatment sessions, patient will verbalize at least 5 positive affirmation statements in each of 2 treatment sessions to increase self-esteem post d/c.  Outcome: Progressing Treatment Session 1; Completed 1 out of 2: At approximately 1:00 pm, LRT met with patient in patient room. Patient verbalized 5 positive affirmation statements. Patient reported it felt "good". LRT encouraged patient to continue saying positive affirmation statements.  Leonette Monarch, LRT/CTRS 05.16.17 1:27 pm Goal: STG-Other Recreation Therapy Goal (Specify) STG: Stress Management - Within 4 treatment sessions, patient will verbalize understanding of the stress management techniques in each of 2 treatment sessions to increase stress management skills post d/c.  Outcome: Progressing Treatment Session 1; Completed 1 out of 2: At approximately 1:00 pm, LRT met with patient in patient room. LRT educated and provided patient with handouts on stress management techniques. Patient verbalized understanding. LRT encouraged patient to continue saying positive affirmation statements.  Leonette Monarch, LRT/CTRS 05.16.17 1:29 pm
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
## 2016-03-02 NOTE — Tx Team (Signed)
Interdisciplinary Treatment Plan Update (Adult)         Date: 03/02/2016   Time Reviewed: 9:30 AM   Progress in Treatment: Improving Attending groups: Yes  Participating in groups: Yes  Taking medication as prescribed: Yes  Tolerating medication: Yes  Family/Significant other contact made: Yes, CSW has spoken with the pt's mother  Patient understands diagnosis: Yes  Discussing patient identified problems/goals with staff: Yes  Medical problems stabilized or resolved: Yes  Denies suicidal/homicidal ideation: Yes  Issues/concerns per patient self-inventory: Yes  Other:   New problem(s) identified: N/A   Discharge Plan or Barriers:    Reason for Continuation of Hospitalization:   Depression   Anxiety   Medication Stabilization   Comments: N/A   Estimated length of stay: 3-5 days     Patient is a 40 year old woman transferred from [facility] emergency room. She presented there is saying that she was depressed and also requesting detox claiming that she had been using heroin. On evaluation today the patient says that her chief complaint as having bipolar disorder. She says for the last week her mood has been very bad. Down and also anxious. Feels like her thoughts are racing all the time. Feels tired all the time. Eating okay. Denies homicidal ideation but says that she was using drugs in an attempt to try to kill her self because she feels hopeless and upset with herself. She has not been compliant with her prescribed psychiatric medicine in a couple of months. She says to me also that she was snorting heroin and using cocaine. Her drug screen is not really consistent with that having marijuana but no opiates in it and having benzodiazepines. Patient indicates she's been under a lot of stress recently. She and her mother relocated from [place] up to [place] and were staying in a motel. Not really clear what she was doing up there.  Patient lives in [place], [place].  Patient will  benefit from crisis stabilization, medication evaluation, group therapy, and psycho education in addition to case management for discharge planning. Patient and CSW reviewed pt's identified goals and treatment plan. Pt verbalized understanding and agreed to treatment plan.    Review of initial/current patient goals per problem list:  1. Goal(s): Patient will participate in aftercare plan   Met: Yes  Target date: 3-5 days post admission date   As evidenced by: Patient will participate within aftercare plan AEB aftercare provider and housing plan at discharge being identified.   5/16: Pt will discharge to [facility] and [facility] for medication management and substance abuse treatment and with [facility] of [facility] for medication management and  therapy.    2. Goal (s): Patient will exhibit decreased depressive symptoms and suicidal ideations.   Met: No  Target date: 3-5 days post admission date   As evidenced by: Patient will utilize self-rating of depression at 3 or below and demonstrate decreased signs of depression or be deemed stable for discharge by MD.   5/16: Goal progressing. Pt denies SI/HI.      3. Goal(s): Patient will demonstrate decreased signs and symptoms of anxiety.   Met: No  Target date: 3-5 days post admission date   As evidenced by: Patient will utilize self-rating of anxiety at 3 or below and demonstrated decreased signs of anxiety, or be deemed stable for discharge by MD   5/16: Goal progressing.    4. Goal(s): Patient will demonstrate decreased signs of withdrawal due to  substance abuse   Met: Yes  Target date: 3-5 days post admission date   As evidenced by: Patient will produce a CIWA/COWS score of 0, have stable vitals signs, and no symptoms of withdrawal   5/16: Patient produced a CIWA/COWS score of 0, has stable vitals signs, and no symptoms of withdrawal   5. Goal (s): Patient will demonstrate  decreased signs of mania  * Met: No * Target date: 3-5 days post admission date  * As evidenced by: Patient demonstrate decreased signs of mania AEB decreased mood instability and demonstration of stable mood   5/16: Goal progressing.    Attendees:  Patient: Mia [name] Family:  Physician: Dr. Bary Leriche, MD    03/02/2016 9:30 AM  Nursing: Elige Radon, RN     03/02/2016 9:30 AM  Clinical Social Worker: Marylou Flesher, [place]  03/02/2016 9:30 AM  Recreational Therapist: Everitt Amber, LRT  03/02/2016 9:30 AM  Psychologist: Consuella Lose PsyD  03/02/2016 9:30 AM  Other:        03/02/2016 9:30 AM  Other:        03/02/2016 9:30 AM     Alphonse Guild. Emrik Erhard, LCSWA, LCAS  03/02/16

## 2016-03-02 NOTE — BHH Group Notes (Signed)
BHH Group Notes:  (Nursing/MHT/Case Management/Adjunct)  Date:  03/02/2016  Time:  2:27 PM  Type of Therapy:  Psychoeducational Skills  Participation Level:  Did Not Attend    [name] 03/02/2016, 2:27 PM

## 2016-03-03 LAB — HEMOGLOBIN A1C: Hgb A1c MFr Bld: 5 % (ref 4.0–6.0)

## 2016-03-03 LAB — LIPID PANEL
CHOLESTEROL: 250 mg/dL — AB (ref 0–200)
HDL: 46 mg/dL (ref >40)
LDL Cholesterol: 176 mg/dL — ABNORMAL HIGH (ref 0–99)
Total CHOL/HDL Ratio: 5.4 RATIO
Triglycerides: 141 mg/dL (ref <150)
VLDL: 28 mg/dL (ref 0–40)

## 2016-03-03 LAB — TSH: TSH: 4.752 u[IU]/mL — AB (ref 0.350–4.500)

## 2016-03-03 LAB — T4, FREE: FREE T4: 1.05 ng/dL (ref 0.61–1.12)

## 2016-03-03 MED ORDER — SULFAMETHOXAZOLE-TRIMETHOPRIM 800-160 MG PO TABS: 1.0000 | ORAL_TABLET | Freq: Two times a day (BID) | ORAL | Status: DC

## 2016-03-03 MED ORDER — TRAZODONE HCL 100 MG PO TABS: 100.0000 mg | ORAL_TABLET | Freq: Every evening | ORAL | Status: DC | PRN

## 2016-03-03 MED ORDER — ZIPRASIDONE HCL 60 MG PO CAPS: 60.0000 mg | ORAL_CAPSULE | Freq: Two times a day (BID) | ORAL | Status: DC

## 2016-03-03 MED ORDER — CLONAZEPAM 1 MG PO TABS
1.0000 mg | ORAL_TABLET | Freq: Two times a day (BID) | ORAL | Status: DC
Start: 2016-03-03 — End: 2017-09-30

## 2016-03-03 MED ORDER — LAMOTRIGINE 200 MG PO TABS: 200.0000 mg | ORAL_TABLET | Freq: Every day | ORAL | Status: DC

## 2016-03-03 NOTE — Progress Notes (Signed)
D: Pt denies SI/HI/AVH. Pt is pleasant and cooperative, affect is bright, appears less anxious and she is interacting with peers and staff appropriately.  A: Pt was offered support and encouragement. Pt was given scheduled medications. Pt was encouraged to attend groups. Q 15 minute checks were done for safety.  R:Pt attends groups and interacts well with peers and staff. Pt is taking medication. Pt has no complaints.Pt receptive to treatment and safety maintained on unit.   

## 2016-03-03 NOTE — Plan of Care (Signed)
Problem: Alteration in mood & ability to function due to Goal: LTG-Pt reports reduction in suicidal thoughts (Patient reports reduction in suicidal thoughts and is able to verbalize a safety plan for whenever patient is feeling suicidal)  Outcome: Progressing Patient denies SI.      

## 2016-03-03 NOTE — Plan of Care (Signed)
Problem: Alteration in mood & ability to function due to Goal: LTG-Pt verbalizes understanding of importance of med regimen (Patient verbalizes understanding of importance of medication regimen and need to continue outpatient care and support groups)  Outcome: Progressing Patient complaint with meds.

## 2016-03-03 NOTE — BHH Suicide Risk Assessment (Signed)
[facility]   Principal Problem: Bipolar 1 disorder, mixed, moderate (HCC) Discharge Diagnoses:  Patient Active Problem List   Diagnosis Date Noted  . Suicidal ideation [R45.851] 03/01/2016  . UTI (urinary tract infection) [N39.0] 03/01/2016  . Bipolar 1 disorder, mixed, moderate (HCC) [F31.62]   . Opioid use disorder, moderate, dependence (HCC) [F11.20] 02/28/2016  . Sedative, hypnotic or anxiolytic use disorder, severe, dependence (HCC) [F13.20] 02/28/2016  . Substance induced mood disorder (HCC) [F19.94] 02/28/2016    Total Time spent with patient: 30 minutes  Musculoskeletal: Strength & Muscle Tone: within normal limits Gait & Station: normal Patient leans: N/A  Psychiatric Specialty Exam: Review of Systems  Psychiatric/Behavioral: Positive for substance abuse.  All other systems reviewed and are negative.   Blood pressure 107/66, pulse 69, temperature 98.4 F (36.9 C), temperature source Oral, resp. rate 18, height 5\' 4"  (1.626 m), weight 76.658 kg (169 lb), last menstrual period 12/14/2015, SpO2 99 %.Body mass index is 28.99 kg/(m^2).  General Appearance: Casual  Eye Contact::  Good  Speech:  Clear and Coherent409  Volume:  Normal  Mood:  Euthymic  Affect:  Appropriate  Thought Process:  Goal Directed  Orientation:  Full (Time, Place, and Person)  Thought Content:  WDL  Suicidal Thoughts:  No  Homicidal Thoughts:  No  Memory:  Immediate;   Fair Recent;   Fair Remote;   Fair  Judgement:  Impaired  Insight:  Shallow  Psychomotor Activity:  Normal  Concentration:  Fair  Recall:  FiservFair  Fund of Knowledge:Fair  Language: Fair  Akathisia:  No  Handed:  Right  AIMS (if indicated):     Assets:  Communication Skills Desire for Improvement Physical Health Resilience Social Support  Sleep:  Number of Hours: 9  Cognition: WNL  ADL's:  Intact   Mental Status Per Nursing Assessment::   On Admission:     Demographic Factors:  Divorced  or widowed, Caucasian, Low socioeconomic status and Unemployed  Loss Factors: Financial problems/change in socioeconomic status  Historical Factors: Family history of mental illness or substance abuse and Impulsivity  Risk Reduction Factors:   Responsible for children under 39 years of age, Sense of responsibility to family, Living with another person, especially a relative and Positive social support  Continued Clinical Symptoms:  Bipolar Disorder:   Depressive phase Alcohol/Substance Abuse/Dependencies  Cognitive Features That Contribute To Risk:  None    Suicide Risk:  Minimal: No identifiable suicidal ideation.  Patients presenting with no risk factors but with morbid ruminations; may be classified as minimal risk based on the severity of the depressive symptoms  Follow-up Information    Follow up with [facility].      Follow up with [facility].      Plan Of Care/Follow-up recommendations:  Activity:  As tolerated. Diet:  Low sodium heart failure. Other:  Keep follow-up appointments.  Kristine LineaJolanta Philmore Lepore, MD 03/03/2016, 11:39 AM

## 2016-03-03 NOTE — Progress Notes (Addendum)
  [facility] :  Will you be returning to the same living situation after discharge:  Yes,  pt [facility] with her mother At discharge, do you have transportation home?: Yes,  pt will be picked up by her mother Do you have the ability to pay for your medications: Yes,  pt will be provided with medications at discharge  Release of information consent forms completed and in the chart;  Patient's signature needed at discharge.  Patient to Follow up at: Follow-up Information    [facility].   Why:  Please arrive to the walk-in clinic Mondays, Wednesdays and Fridays between the hours of 9am-11:30am for an assessment for outpatient substance abuse treatment, medication managment and therapy.   Contact information:   [address] [place], [place] [address] Phone: [phone] Fax: [phone]      Go to [facility].   Why:  Please arrive to the walk-in clinic Monday through Friday from 9-4pm for your assessment for your hospital follow up for medication management and therapy.   Contact information:   [address] [place], [place] [address] Phone: [phone]([phone] Fax: [phone]      Next level of care provider has access to [facility] Link:no  Safety Planning and Suicide Prevention discussed: Yes,  completed with pt  Have you used any form of tobacco in the last 30 days? (Cigarettes, Smokeless Tobacco, Cigars, and/or Pipes): Yes  Has patient been referred to the Quitline?: Patient refused referral  Patient has been referred for addiction treatment: Yes  Dorothe PeaJonathan F Lemoyne Nestor 03/03/2016, 1:44 PM

## 2016-03-03 NOTE — Tx Team (Signed)
Interdisciplinary Treatment Plan Update (Adult)         Date: 03/03/2016   Time Reviewed: 9:30 AM   Progress in Treatment: Improving Attending groups: Yes  Participating in groups: Yes  Taking medication as prescribed: Yes  Tolerating medication: Yes  Family/Significant other contact made: Yes, CSW has spoken with the pt's mother  Patient understands diagnosis: Yes  Discussing patient identified problems/goals with staff: Yes  Medical problems stabilized or resolved: Yes  Denies suicidal/homicidal ideation: Yes  Issues/concerns per patient self-inventory: Yes  Other:   New problem(s) identified: N/A   Discharge Plan or Barriers:    Reason for Continuation of Hospitalization:   Depression   Anxiety   Medication Stabilization   Comments: N/A   Estimated date of discharge: 03/03/16     Patient is a 40 year old woman transferred from [facility] emergency room. She presented there is saying that she was depressed and also requesting detox claiming that she had been using heroin. On evaluation today the patient says that her chief complaint as having bipolar disorder. She says for the last week her mood has been very bad. Down and also anxious. Feels like her thoughts are racing all the time. Feels tired all the time. Eating okay. Denies homicidal ideation but says that she was using drugs in an attempt to try to kill her self because she feels hopeless and upset with herself. She has not been compliant with her prescribed psychiatric medicine in a couple of months. She says to me also that she was snorting heroin and using cocaine. Her drug screen is not really consistent with that having marijuana but no opiates in it and having benzodiazepines. Patient indicates she's been under a lot of stress recently. She and her mother relocated from [place] up to [place] and were staying in a motel. Not really clear what she was doing up there.  Patient lives in [place], [place].  Patient  will benefit from crisis stabilization, medication evaluation, group therapy, and psycho education in addition to case management for discharge planning. Patient and CSW reviewed pt's identified goals and treatment plan. Pt verbalized understanding and agreed to treatment plan.    Review of initial/current patient goals per problem list:  1. Goal(s): Patient will participate in aftercare plan   Met: Yes  Target date: 3-5 days post admission date   As evidenced by: Patient will participate within aftercare plan AEB aftercare provider and housing plan at discharge being identified.   5/16: Pt will discharge to [facility] and will follow [facility] with [facility] for medication management and substance abuse treatment and with [facility] of [facility] for medication management and  therapy.    2. Goal (s): Patient will exhibit decreased depressive symptoms and suicidal ideations.   Met: Adequate for discharge per MD.  Target date: 3-5 days post admission date   As evidenced by: Patient will utilize self-rating of depression at 3 or below and demonstrate decreased signs of depression or be deemed stable for discharge by MD.   5/16: Goal progressing. Pt denies SI/HI.    5/17: Adequate for discharge per MD.  Pt denies SI/HI.  Pt reports she is safe for discharge.   3. Goal(s): Patient will demonstrate decreased signs and symptoms of anxiety.   Met: Adequate for discharge per MD.  Target date: 3-5 days post admission date   As evidenced by: Patient will utilize self-rating of anxiety at 3 or below and demonstrated decreased signs of anxiety, or be  deemed stable for discharge by MD   5/16: Goal progressing.  5/17: Adequate for discharge per MD.  Pt reports baseline symptoms of anxiety   4. Goal(s): Patient will demonstrate decreased signs of withdrawal due to substance abuse   Met: Yes  Target date: 3-5 days post admission date   As evidenced by: Patient will  produce a CIWA/COWS score of 0, have stable vitals signs, and no symptoms of withdrawal   5/16: Patient produced a CIWA/COWS score of 0, has stable vitals signs, and no symptoms of withdrawal   5. Goal (s): Patient will demonstrate decreased signs of mania  * Met: Adequate for discharge per MD. * Target date: 3-5 days post admission date  * As evidenced by: Patient demonstrate decreased signs of mania AEB decreased mood instability and demonstration of stable mood   5/16: Goal progressing.  5/17: Adequate for discharge per MD.    Attendees:  Patient:  Family:  Physician: Dr. Bary Leriche, MD    03/03/2016 9:30 AM  Nursing: Carolynn Sayers, RN    03/03/2016 9:30 AM  Clinical Social Worker: Marylou Flesher, [place]  03/03/2016 9:30 AM  Recreational Therapist: Everitt Amber, LRT  03/03/2016 9:30 AM  Psychologist: Consuella Lose PsyD  03/03/2016 9:30 AM  Nursing: Geanie Berlin, RN    03/03/2016 9:30 AM  Nursing: Polly Cobia, RN    03/03/2016 9:30 AM     Alphonse Guild. Jahnessa Vanduyn, LCSWA, LCAS  03/03/16

## 2016-03-03 NOTE — Plan of Care (Signed)
Problem: [facility] Participation in Recreation Therapeutic Interventions Goal: STG-Patient will demonstrate improved self esteem by identif STG: Self-Esteem - Within 4 treatment sessions, patient will verbalize at least 5 positive affirmation statements in each of 2 treatment sessions to increase self-esteem post d/c.  Outcome: Completed/Met Date Met:  03/03/16 Treatment Session 2; Completed 2 out of 2: At approximately 11:55 am, LRT met with patient in patient room. Patient verbalized 5 positive affirmation statements. Patient reported it felt "good". LRT encouraged patient to continue saying positive affirmation statements  Leonette [facility], LRT/CTRS 05.17.17 3:19 pm Goal: STG-Other Recreation Therapy Goal (Specify) STG: Stress Management - Within 4 treatment sessions, patient will verbalize understanding of the stress management techniques in each of 2 treatment sessions to increase stress management skills post d/c.  Outcome: Completed/Met Date Met:  03/03/16 Treatment Session 2; Completed 2 out of 2: At approximately 11:55 am, LRT met with patient in patient room. Patient reported she read over and practiced the stress management techniques. Patient verbalized understanding and reported the techniques were helpful. LRT encouraged patient to continue practicing the stress management techniques.  Leonette [facility], LRT/CTRS 05.17.17 3:21 pm
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
## 2016-03-03 NOTE — Progress Notes (Signed)
Patient with appropriate affect, cooperative behavior with meals, meds and plan of care. No SI/HI at this time. Patient is actively participating in plan of care. Patient speaks with mother on phone rt possible discharge today. Therapy groups encouraged, safety maintained.

## 2016-03-03 NOTE — Progress Notes (Signed)
Recreation Therapy Notes  Date: 05.17.17 Time: 1:00 pm Location: Craft Room  Group Topic: Self-esteem  Goal Area(s) Addresses:  Patient will write at least one positive trait. Patient will verbalize benefit of having a healthy self-esteem.  Behavioral Response: Attentive, Interactive  Intervention: I Am  Activity: Patients were given a worksheet with the letter I on it and instructed to write as many positive traits inside the letter.  Education: LRT educated patients on ways they can increase their self-esteem.  Education Outcome: Acknowledges education/In group clarification offered   Clinical Observations/Feedback: Patient completed activity by writing positive traits. Patient contributed to group discussion by stating how it felt to list positive traits, and how she can increase her self-esteem.  Jacquelynn CreeGreene,Brenae Lasecki M, LRT/CTRS 03/03/2016 2:11 PM

## 2016-03-03 NOTE — Discharge Summary (Addendum)
Physician Discharge Summary Note  Patient:  Mia [name] is an 40 y.o., female MRN:  [id number] DOB:  1975-11-23 Patient phone:  [phone] (home)  Patient address:   [address] [place] [address],  Total Time spent with patient: 30 minutes  Date of Admission:  02/28/2016 Date of Discharge: 03/03/2016  Reason for Admission:  Agitation.  History of Present Illness:: 40 year old woman transferred from [facility] emergency room. She presented there is saying that she was depressed and also requesting detox claiming that she had been using heroin. On evaluation today the patient says that her chief complaint as having bipolar disorder. She says for the last week her mood has been very bad. Down and also anxious. Feels like her thoughts are racing all the time. Feels tired all the time. Eating okay. Denies homicidal ideation but says that she was using drugs in an attempt to try to kill her self because she feels hopeless and upset with herself. She has not been compliant with her prescribed psychiatric medicine in a couple of months. She says to me also that she was snorting heroin and using cocaine. Her drug screen is not really consistent with that having marijuana but no opiates in it and having benzodiazepines. Patient indicates she's been under a lot of stress recently. She and her mother relocated from [place] up to [place] and were staying in a motel. Not really clear what she was doing up there.  Associated Signs/Symptoms:  Depression Symptoms: depressed mood, anhedonia,  psychomotor retardation, fatigue, feelings of worthlessness/guilt, difficulty concentrating, suicidal thoughts without plan, (Hypo) Manic Symptoms: Distractibility, Anxiety Symptoms: Excessive Worry, Psychotic Symptoms: Hallucinations: Auditory PTSD Symptoms: Hypervigilance: Yes Total Time spent with patient: 1 hour  Past Psychiatric History: Patient indicates that she's had hospitalizations in  the past. Says that she's been diagnosed with bipolar disorder. She only recently moved to [place] from [place] where she had been for a long time. In [place] she had hospitalizations and was also seeing a doctor in the [place] area. She's been treated with Lamictal trazodone Klonopin and some other medicine that she can't remember. Denies serious suicide attempts in the past.  Family psychiatric history. Mother with bipolar.  Social history. She relocated to [facility] from [place] about 2 weeks ago. She stays at the hotel with her mother. Her 33 years old daughter lives in our area with a relative and the patient wants to reconnect. She has [place]. She has no income at present.   Principal Problem: Bipolar 1 disorder, mixed, moderate (HCC) Discharge Diagnoses: Patient Active Problem List   Diagnosis Date Noted  . Suicidal ideation [R45.851] 03/01/2016  . UTI (urinary tract infection) [N39.0] 03/01/2016  . Bipolar 1 disorder, mixed, moderate (HCC) [F31.62]   . Opioid use disorder, moderate, dependence (HCC) [F11.20] 02/28/2016  . Sedative, hypnotic or anxiolytic use disorder, severe, dependence (HCC) [F13.20] 02/28/2016  . Substance induced mood disorder [facility]) [F19.94] 02/28/2016    Past Medical History:  Past Medical History  Diagnosis Date  . Anxiety   . Bipolar 1 disorder [facility])     Past Surgical History  Procedure Laterality Date  . Cesarean section     Family History: History reviewed. No pertinent family history.  Social History:  History  Alcohol Use No     History  Drug Use No    Social History   Social History  . Marital Status: Single    Spouse Name: N/A  . Number of Children: N/A  .  Years of Education: N/A   Social History Main Topics  . Smoking status: Current Some Day Smoker -- 1.00 packs/day    Types: Cigarettes  . Smokeless tobacco: Never Used  . Alcohol Use: No  . Drug Use: No  . Sexual Activity: Not Asked   Other Topics  Concern  . None   Social History Narrative    Hospital Course:    Mia [name] is a 40 year old female with history of bipolar disorder admitted for worsening of depression and psychotic disorganization in the context of severe social stressors   1. Suicidal ideation. This has resolved. The patient is able to contract for safety. She is forward thinking and optimistic about the future. She is a loving mother and daughter.  2. Mood. We continued Geodon and Lamictal for mood stabilization and psychotic disorganization.  3 Anxiety. We continued clonazepam 1 mg twice daily as the community.  4. Insomnia. She is on trazodone.   5. Substance abuse. She minimizes her problems and declines treatment.  6. UTI. She is on Septra.  7. Metabolic syndrome monitoring. Lipid profile and TSH are slightly elevated. We ordered free T4. HgbA1C and prolactin are pending.   8. Disposition. She was discharged with her mother. [facility] [place].   Physical Findings: AIMS: Facial and Oral Movements Muscles of Facial Expression: None, normal Lips and Perioral Area: None, normal Jaw: None, normal Tongue: None, normal,Extremity Movements Upper (arms, wrists, hands, fingers): None, normal Lower (legs, knees, ankles, toes): None, normal, Trunk Movements Neck, shoulders, hips: None, normal, Overall Severity Severity of abnormal movements (highest score from questions above): None, normal Incapacitation due to abnormal movements: None, normal Patient's awareness of abnormal movements (rate only patient's report): No Awareness, Dental Status Current problems with teeth and/or dentures?: No Does patient usually wear dentures?: No  CIWA:  CIWA-Ar Total: 2 COWS:     Musculoskeletal: Strength & Muscle Tone: within normal limits Gait & Station: normal Patient leans: N/A  Psychiatric Specialty Exam: Review of Systems  All other systems reviewed and are negative.   Blood  pressure 107/66, pulse 69, temperature 98.4 F (36.9 C), temperature source Oral, resp. rate 18, height 5\' 4"  (1.626 m), weight 76.658 kg (169 lb), last menstrual period 12/14/2015, SpO2 99 %.Body mass index is 28.99 kg/(m^2).  See SRA.                                                  Sleep:  Number of Hours: 9   Have you used any form of tobacco in the last 30 days? (Cigarettes, Smokeless Tobacco, Cigars, and/or Pipes): Yes  Has this patient used any form of tobacco in the last 30 days? (Cigarettes, Smokeless Tobacco, Cigars, and/or Pipes) Yes, Yes, A prescription for an FDA-approved tobacco cessation medication was offered at discharge and the patient refused  Blood Alcohol level:  Lab Results  Component Value Date   ETH <5 02/27/2016    Metabolic Disorder Labs:  No results found for: HGBA1C, MPG No results found for: PROLACTIN No results found for: CHOL, TRIG, HDL, CHOLHDL, VLDL, LDLCALC  See Psychiatric Specialty Exam and Suicide Risk Assessment completed by Attending Physician prior to discharge.  Discharge destination:  Home  Is patient on multiple antipsychotic therapies at discharge:  No   Has Patient had three or more failed trials of  antipsychotic monotherapy by history:  No  Recommended Plan for Multiple Antipsychotic Therapies: NA  Discharge Instructions    Diet - low sodium heart healthy    Complete by:  As directed      Increase activity slowly    Complete by:  As directed             Medication List    TAKE these medications      Indication   clonazePAM 1 MG tablet  Commonly known as:  KLONOPIN  Take 1 tablet (1 mg total) by mouth 2 (two) times daily.   Indication:  Manic-Depression     lamoTRIgine 200 MG tablet  Commonly known as:  LAMICTAL  Take 1 tablet (200 mg total) by mouth at bedtime.   Indication:  Manic-Depression     sulfamethoxazole-trimethoprim 800-160 MG tablet  Commonly known as:  BACTRIM DS,SEPTRA DS  Take 1  tablet by mouth every 12 (twelve) hours.      traZODone 100 MG tablet  Commonly known as:  DESYREL  Take 1 tablet (100 mg total) by mouth at bedtime as needed for sleep.   Indication:  Trouble Sleeping     ziprasidone 60 MG capsule  Commonly known as:  GEODON  Take 1 capsule (60 mg total) by mouth 2 (two) times daily with a meal.   Indication:  Manic-Depression           Follow-up Information    Follow up with [facility].      Follow up with [facility].      Follow-up recommendations:  Activity:  As tolerated. Diet:  Low sodium heart healthy. Other:  Keep follow-up appointments.  Comments:    Signed: Kristine Linea, MD 03/03/2016, 11:46 AM

## 2016-03-03 NOTE — Progress Notes (Signed)
Recreation Therapy Notes  INPATIENT RECREATION TR PLAN  Patient Details Name: Mia [name] MRN: [id number] DOB: 12-Dec-1975 Today's Date: 03/03/2016  Rec Therapy Plan Is patient appropriate for Therapeutic Recreation?: Yes Treatment times per week: At least once a week TR Treatment/Interventions: 1:1 session, Group participation (Comment) (Appropriate participation in daily recreaional therapy tx)  Discharge Criteria Pt will be discharged from therapy if:: Treatment goals are met, Discharged Treatment plan/goals/alternatives discussed and agreed upon by:: Patient/family  Discharge Summary Short term goals set: See Care Plan Short term goals met: Complete Progress toward goals comments: One-to-one attended Which groups?: Self-esteem, Wellness, Other (Comment) (Self-expression) One-to-one attended: Self-esteem, stress management Reason goals not met: N/A Therapeutic equipment acquired: None Reason patient discharged from therapy: Discharge from hospital Pt/family agrees with progress & goals achieved: Yes Date patient discharged from therapy: 03/03/16   Leonette [facility], LRT/CTRS 03/03/2016, 3:22 PM

## 2016-03-03 NOTE — [facility] Suicide Risk Assessment (Signed)
[facility] INPATIENT:  Family/Significant Other Suicide Prevention Education  Suicide Prevention Education:  Contact Attempts: with the pt's mother at ph: [phone], (name of family member/significant other) has been identified by the patient as the family member/significant other with whom the patient will be residing, and identified as the person(s) who will aid the patient in the event of a mental health crisis.  With written consent from the patient, two attempts were made to provide suicide prevention education, prior to and/or following the patient's discharge.  We were unsuccessful in providing suicide prevention education.  A suicide education pamphlet was given to the patient to share with family/significant other. CSW completed SPE with the pt.  Date and time of first attempt:03/03/16 at 12:00pm Date and time of second attempt:03/03/16 at 12:50pm  Mercy RidingJonathan F Donyell [name] 03/03/2016, 12:57 PM

## 2016-03-04 LAB — PROLACTIN: Prolactin: 151.1 ng/mL — ABNORMAL HIGH (ref 4.8–23.3)

## 2016-07-24 ENCOUNTER — Emergency Department (HOSPITAL_BASED_OUTPATIENT_CLINIC_OR_DEPARTMENT_OTHER): Payer: Medicaid - Out of State

## 2016-07-24 ENCOUNTER — Encounter (HOSPITAL_BASED_OUTPATIENT_CLINIC_OR_DEPARTMENT_OTHER): Payer: Self-pay | Admitting: Emergency Medicine

## 2016-07-24 ENCOUNTER — Emergency Department (HOSPITAL_BASED_OUTPATIENT_CLINIC_OR_DEPARTMENT_OTHER)
Admission: EM | Admit: 2016-07-24 | Discharge: 2016-07-24 | Disposition: A | Discharge status: Home / Self Care | Payer: Medicaid - Out of State | Attending: Emergency Medicine | Admitting: Emergency Medicine

## 2016-07-24 DIAGNOSIS — F17210 Nicotine dependence, cigarettes, uncomplicated: Secondary | ICD-10-CM | POA: Diagnosis not present

## 2016-07-24 DIAGNOSIS — J069 Acute upper respiratory infection, unspecified: Secondary | ICD-10-CM | POA: Insufficient documentation

## 2016-07-24 DIAGNOSIS — R05 Cough: Secondary | ICD-10-CM | POA: Diagnosis present

## 2016-07-24 DIAGNOSIS — B9789 Other viral agents as the cause of diseases classified elsewhere: Secondary | ICD-10-CM

## 2016-07-24 MED ORDER — IPRATROPIUM-ALBUTEROL 0.5-2.5 (3) MG/3ML IN SOLN
RESPIRATORY_TRACT | Status: AC
  Administered 2016-07-24: 3 mL
  Filled 2016-07-24: qty 3

## 2016-07-24 MED ORDER — ALBUTEROL SULFATE HFA 108 (90 BASE) MCG/ACT IN AERS
2.0000 | INHALATION_SPRAY | RESPIRATORY_TRACT | Status: DC | PRN
  Administered 2016-07-24: 2 via RESPIRATORY_TRACT
  Filled 2016-07-24: qty 6.7

## 2016-07-24 MED ORDER — PREDNISONE 50 MG PO TABS: 50.0000 mg | ORAL_TABLET | Freq: Every day | ORAL | 0 refills | Status: DC

## 2016-07-24 MED ORDER — PROMETHAZINE-DM 6.25-15 MG/5ML PO SYRP: 5.0000 mL | ORAL_SOLUTION | Freq: Four times a day (QID) | ORAL | 0 refills | Status: DC | PRN

## 2016-07-24 MED ORDER — AEROCHAMBER PLUS FLO-VU MEDIUM MISC
1.0000 | Freq: Once | Status: AC
  Administered 2016-07-24: 1
  Filled 2016-07-24: qty 1

## 2016-07-24 MED ORDER — GUAIFENESIN ER 1200 MG PO TB12: 1.0000 | ORAL_TABLET | Freq: Two times a day (BID) | ORAL | 0 refills | Status: DC

## 2016-07-24 NOTE — ED Notes (Signed)
Pt made aware to return if symptoms worsen or if any life threatening symptoms occur.   

## 2016-07-24 NOTE — ED Notes (Signed)
Patient denies pain and is resting comfortably.  

## 2016-07-24 NOTE — ED Triage Notes (Signed)
Pt reports cough and URI sx x1 week.  Pt is coughing up yellow sputum and has chest congestion.  Pt had pneumonia in February.

## 2016-07-24 NOTE — ED Provider Notes (Signed)
[facility] Provider Note   CSN: [id number] Arrival date & time: 07/24/16  1248     History   Chief Complaint Chief Complaint  Patient presents with  . Cough    HPI Mia [name] is a 40 y.o. female.  HPI Patient presents to the emergency department with cough and nasal congestion over the last 6 days.  The patient states that he took over-the-counter cough and cold medications without relief of her symptoms.  Patient states nothing seems make the condition better or worse.  She states that she has been coughing up mucus.  She states that her coughing is worse at night when she lays down. The patient denies chest pain, shortness of breath, headache,blurred vision, neck pain, fever, weakness, numbness, dizziness, anorexia, edema, abdominal pain, nausea, vomiting, diarrhea, rash, back pain, dysuria, hematemesis, bloody stool, near syncope, or syncope. Past Medical History:  Diagnosis Date  . Anxiety   . Bipolar 1 disorder [facility])     Patient Active Problem List   Diagnosis Date Noted  . Suicidal ideation 03/01/2016  . UTI (urinary tract infection) 03/01/2016  . Bipolar 1 disorder, mixed, moderate (HCC)   . Opioid use disorder, moderate, dependence (HCC) 02/28/2016  . 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 Substance induced mood disorder (HCC) 02/28/2016    Past Surgical History:  Procedure Laterality Date  . CESAREAN SECTION      OB History    No data available       Home Medications    Prior to Admission medications   Medication Sig Start Date End Date Taking? Authorizing Provider  clonazePAM (KLONOPIN) 1 MG tablet Take 1 tablet (1 mg total) by mouth 2 (two) times daily. 03/03/16   Shari Prows, MD  lamoTRIgine (LAMICTAL) 200 MG tablet Take 1 tablet (200 mg total) by mouth at bedtime. 03/03/16   Shari Prows, MD  sulfamethoxazole-trimethoprim (BACTRIM DS,SEPTRA DS) 800-160 MG tablet Take 1  tablet by mouth every 12 (twelve) hours. 03/03/16   Shari Prows, MD  traZODone (DESYREL) 100 MG tablet Take 1 tablet (100 mg total) by mouth at bedtime as needed for sleep. 03/03/16   Shari Prows, MD  ziprasidone (GEODON) 60 MG capsule Take 1 capsule (60 mg total) by mouth 2 (two) times daily with a meal. 03/03/16   Shari Prows, MD    Family History History reviewed. No pertinent family history.  Social History Social History  Substance Use Topics  . Smoking status: Current Some Day Smoker    Packs/day: 1.00    Types: Cigarettes  . Smokeless tobacco: Never Used  . Alcohol use No     Allergies   Review of patient's allergies indicates no known allergies.   Review of Systems Review of Systems All other systems negative except as documented in the HPI. All pertinent positives and negatives as reviewed in the HPI.  Physical Exam Updated Vital Signs BP 136/90 (BP Location: Right Arm)   Pulse 73   Temp 98 F (36.7 C) (Oral)   Resp 18   Ht 5\' 4"  (1.626 m)   Wt 86.7 kg   LMP 06/17/2016 (Approximate)   SpO2 100%   BMI 32.80 kg/m   Physical Exam  Constitutional: She is oriented to person, place, and time. She appears well-developed and well-nourished. No distress.  HENT:  Head: Normocephalic and atraumatic.  Mouth/Throat: Oropharynx is clear and moist.  Eyes: Pupils are equal, round, and reactive to  light.  Neck: Normal range of motion. Neck supple.  Cardiovascular: Normal rate, regular rhythm and normal heart sounds.  Exam reveals no gallop and no friction rub.   No murmur heard. Pulmonary/Chest: Effort normal and breath sounds normal. No respiratory distress. She has no wheezes.  Abdominal: Soft. Bowel sounds are normal. She exhibits no distension. There is no tenderness.  Neurological: She is alert and oriented to person, place, and time. She exhibits normal muscle tone. Coordination normal.  Skin: Skin is warm and dry. No rash noted. No erythema.   Psychiatric: She has a normal mood and affect. Her behavior is normal.  Nursing note and vitals reviewed.    ED Treatments / Results  Labs (all labs ordered are listed, but only abnormal results are displayed) Labs Reviewed - No data to display  EKG  EKG Interpretation None       Radiology Dg Chest 2 View  Result Date: 07/24/2016 CLINICAL DATA:  Cough and upper respiratory infection for 1 week. EXAM: CHEST  2 VIEW COMPARISON:  None. FINDINGS: The heart size and mediastinal contours are within normal limits. Both lungs are clear. The visualized skeletal structures are unremarkable. IMPRESSION: No active cardiopulmonary disease. Electronically Signed   By: Elsie StainJohn T Curnes M.D.   On: 07/24/2016 15:32    Procedures Procedures (including critical care time)  Medications Ordered in ED Medications  ipratropium-albuterol (DUONEB) 0.5-2.5 (3) MG/3ML nebulizer solution (3 mLs  Given 07/24/16 1450)     Initial Impression / Assessment and Plan / ED Course  I have reviewed the triage vital signs and the nursing notes.  Pertinent labs & imaging results that were available during my care of the patient were reviewed by me and considered in my medical decision making (see chart for details).  Clinical Course    Patient be treated for upper respiratory infection with cough.  Told to return here as needed.  Patient is advised to increase her fluid intake and rest as much possible.  The patient has no chest x-ray findings that are concerning for pneumonia.  She does have clear lung sounds, as well  Final Clinical Impressions(s) / ED Diagnoses   Final diagnoses:  None    New Prescriptions New Prescriptions   No medications on file     Charlestine NightChristopher Keiton Cosma, PA-C 07/24/16 1605    Pricilla LovelessScott Goldston, MD 07/30/16 1740

## 2016-07-24 NOTE — Discharge Instructions (Signed)
Your chest x-ray did not show any signs of pneumonia.  Increase your fluid intake and rest as much as possible.  Follow-up with her primary care doctor

## 2017-04-05 DIAGNOSIS — F604 Histrionic personality disorder: Secondary | ICD-10-CM

## 2017-04-05 DIAGNOSIS — F1011 Alcohol abuse, in remission: Secondary | ICD-10-CM

## 2017-04-05 DIAGNOSIS — F17200 Nicotine dependence, unspecified, uncomplicated: Secondary | ICD-10-CM | POA: Insufficient documentation

## 2017-04-05 DIAGNOSIS — F1120 Opioid dependence, uncomplicated: Secondary | ICD-10-CM | POA: Insufficient documentation

## 2017-04-05 DIAGNOSIS — F1410 Cocaine abuse, uncomplicated: Secondary | ICD-10-CM

## 2017-04-05 HISTORY — DX: Alcohol abuse, in remission: F10.11

## 2017-04-05 HISTORY — DX: Histrionic personality disorder: F60.4

## 2017-04-05 HISTORY — DX: Nicotine dependence, unspecified, uncomplicated: F17.200

## 2017-04-05 HISTORY — DX: Cocaine abuse, uncomplicated: F14.10

## 2017-04-05 HISTORY — DX: Opioid dependence, uncomplicated: F11.20

## 2017-09-24 ENCOUNTER — Other Ambulatory Visit: Payer: Self-pay

## 2017-09-24 ENCOUNTER — Emergency Department (HOSPITAL_BASED_OUTPATIENT_CLINIC_OR_DEPARTMENT_OTHER)
Admission: EM | Admit: 2017-09-24 | Discharge: 2017-09-24 | Disposition: A | Discharge status: Home / Self Care | Payer: Medicaid Other | Attending: Emergency Medicine | Admitting: Emergency Medicine

## 2017-09-24 ENCOUNTER — Encounter (HOSPITAL_BASED_OUTPATIENT_CLINIC_OR_DEPARTMENT_OTHER): Payer: Self-pay | Admitting: Emergency Medicine

## 2017-09-24 DIAGNOSIS — Z5321 Procedure and treatment not carried out due to patient leaving prior to being seen by health care provider: Secondary | ICD-10-CM | POA: Insufficient documentation

## 2017-09-24 DIAGNOSIS — R220 Localized swelling, mass and lump, head: Secondary | ICD-10-CM | POA: Diagnosis present

## 2017-09-24 HISTORY — DX: Acne vulgaris: L70.0

## 2017-09-24 NOTE — ED Triage Notes (Addendum)
PT presents with swelling to right side of face since last night.Pt states she has cystic acne and tried to pop a bump last night and now thinks it has spread to cover right side of face.

## 2017-09-24 NOTE — ED Notes (Signed)
Called x1 for room no answer 

## 2017-09-24 NOTE — ED Notes (Signed)
Called for total of 3 times with no answer

## 2017-09-25 NOTE — ED Provider Notes (Signed)
Patient left without being seen  1. Patient left without being seen       Mia [name], Mia Baines, DO 09/25/17 0145

## 2017-09-26 ENCOUNTER — Emergency Department (HOSPITAL_COMMUNITY): Payer: Medicaid Other

## 2017-09-26 ENCOUNTER — Inpatient Hospital Stay (HOSPITAL_COMMUNITY)
Admission: EM | Admit: 2017-09-26 | Discharge: 2017-09-30 | DRG: 872 | Disposition: A | Discharge status: Home / Self Care | Payer: Medicaid Other | Attending: Internal Medicine | Admitting: Internal Medicine

## 2017-09-26 ENCOUNTER — Other Ambulatory Visit: Payer: Self-pay

## 2017-09-26 ENCOUNTER — Encounter (HOSPITAL_COMMUNITY): Payer: Self-pay

## 2017-09-26 DIAGNOSIS — Z9114 Patient's other noncompliance with medication regimen: Secondary | ICD-10-CM

## 2017-09-26 DIAGNOSIS — F419 Anxiety disorder, unspecified: Secondary | ICD-10-CM | POA: Diagnosis present

## 2017-09-26 DIAGNOSIS — A419 Sepsis, unspecified organism: Principal | ICD-10-CM | POA: Diagnosis present

## 2017-09-26 DIAGNOSIS — F17210 Nicotine dependence, cigarettes, uncomplicated: Secondary | ICD-10-CM | POA: Diagnosis present

## 2017-09-26 DIAGNOSIS — E876 Hypokalemia: Secondary | ICD-10-CM | POA: Diagnosis not present

## 2017-09-26 DIAGNOSIS — L03213 Periorbital cellulitis: Secondary | ICD-10-CM | POA: Diagnosis not present

## 2017-09-26 DIAGNOSIS — F3162 Bipolar disorder, current episode mixed, moderate: Secondary | ICD-10-CM | POA: Diagnosis present

## 2017-09-26 DIAGNOSIS — I10 Essential (primary) hypertension: Secondary | ICD-10-CM | POA: Diagnosis present

## 2017-09-26 DIAGNOSIS — F1410 Cocaine abuse, uncomplicated: Secondary | ICD-10-CM | POA: Diagnosis present

## 2017-09-26 DIAGNOSIS — L0390 Cellulitis, unspecified: Secondary | ICD-10-CM

## 2017-09-26 DIAGNOSIS — H5711 Ocular pain, right eye: Secondary | ICD-10-CM | POA: Diagnosis present

## 2017-09-26 DIAGNOSIS — L700 Acne vulgaris: Secondary | ICD-10-CM | POA: Diagnosis present

## 2017-09-26 DIAGNOSIS — F1120 Opioid dependence, uncomplicated: Secondary | ICD-10-CM | POA: Diagnosis present

## 2017-09-26 HISTORY — DX: Periorbital cellulitis: L03.213

## 2017-09-26 LAB — BASIC METABOLIC PANEL
ANION GAP: 10 (ref 5–15)
BUN: 5 mg/dL — ABNORMAL LOW (ref 6–20)
CHLORIDE: 102 mmol/L (ref 101–111)
CO2: 23 mmol/L (ref 22–32)
CREATININE: 0.71 mg/dL (ref 0.44–1.00)
Calcium: 8.9 mg/dL (ref 8.9–10.3)
GFR calc non Af Amer: >60 mL/min (ref >60)
Glucose, Bld: 117 mg/dL — ABNORMAL HIGH (ref 65–99)
POTASSIUM: 5 mmol/L (ref 3.5–5.1)
SODIUM: 135 mmol/L (ref 135–145)

## 2017-09-26 LAB — CBC WITH DIFFERENTIAL/PLATELET
BASOS ABS: 0.1 10*3/uL (ref 0.0–0.1)
BASOS PCT: 0 %
EOS ABS: 0.3 10*3/uL (ref 0.0–0.7)
Eosinophils Relative: 3 %
HCT: 42.5 % (ref 36.0–46.0)
HEMOGLOBIN: 14.7 g/dL (ref 12.0–15.0)
LYMPHS ABS: 1.9 10*3/uL (ref 0.7–4.0)
Lymphocytes Relative: 13 %
MCH: 31.5 pg (ref 26.0–34.0)
MCHC: 34.6 g/dL (ref 30.0–36.0)
MCV: 91 fL (ref 78.0–100.0)
Monocytes Absolute: 0.7 10*3/uL (ref 0.1–1.0)
Monocytes Relative: 5 %
Neutro Abs: 10.9 10*3/uL — ABNORMAL HIGH (ref 1.7–7.7)
Neutrophils Relative %: 79 %
PLATELETS: 328 10*3/uL (ref 150–400)
RBC: 4.67 MIL/uL (ref 3.87–5.11)
RDW: 14 % (ref 11.5–15.5)
WBC: 13.8 10*3/uL — AB (ref 4.0–10.5)

## 2017-09-26 LAB — I-STAT CG4 LACTIC ACID, ED
LACTIC ACID, VENOUS: 2.39 mmol/L — AB (ref 0.5–1.9)
LACTIC ACID, VENOUS: 0.63 mmol/L (ref 0.5–1.9)

## 2017-09-26 LAB — I-STAT BETA HCG BLOOD, ED (MC, WL, AP ONLY): I-stat hCG, quantitative: <5 m[IU]/mL (ref <5)

## 2017-09-26 MED ORDER — ONDANSETRON HCL 4 MG/2ML IJ SOLN
4.0000 mg | Freq: Once | INTRAMUSCULAR | Status: AC
  Administered 2017-09-26: 4 mg via INTRAVENOUS
  Filled 2017-09-26: qty 2

## 2017-09-26 MED ORDER — SODIUM CHLORIDE 0.9 % IV BOLUS (SEPSIS)
1000.0000 mL | Freq: Once | INTRAVENOUS | Status: AC
  Administered 2017-09-26: 1000 mL via INTRAVENOUS

## 2017-09-26 MED ORDER — IOPAMIDOL (ISOVUE-300) INJECTION 61%
INTRAVENOUS | Status: AC
  Administered 2017-09-26: 75 mL via INTRAVENOUS
  Filled 2017-09-26: qty 75

## 2017-09-26 MED ORDER — FENTANYL CITRATE (PF) 100 MCG/2ML IJ SOLN
50.0000 ug | Freq: Once | INTRAMUSCULAR | Status: AC
  Administered 2017-09-26: 50 ug via INTRAVENOUS
  Filled 2017-09-26: qty 2

## 2017-09-26 MED ORDER — SODIUM CHLORIDE 0.9 % IV BOLUS (SEPSIS)
500.0000 mL | Freq: Once | INTRAVENOUS | Status: AC
  Administered 2017-09-26: 500 mL via INTRAVENOUS

## 2017-09-26 MED ORDER — ACETAMINOPHEN 650 MG RE SUPP: 650.0000 mg | Freq: Four times a day (QID) | RECTAL | Status: DC | PRN

## 2017-09-26 MED ORDER — ENOXAPARIN SODIUM 40 MG/0.4ML ~~LOC~~ SOLN
40.0000 mg | SUBCUTANEOUS | Status: DC
  Administered 2017-09-26 – 2017-09-28 (×3): 40 mg via SUBCUTANEOUS
  Filled 2017-09-26 (×3): qty 0.4

## 2017-09-26 MED ORDER — KETOROLAC TROMETHAMINE 30 MG/ML IJ SOLN
30.0000 mg | Freq: Four times a day (QID) | INTRAMUSCULAR | Status: DC | PRN
  Administered 2017-09-26 – 2017-09-30 (×12): 30 mg via INTRAVENOUS
  Filled 2017-09-26 (×12): qty 1

## 2017-09-26 MED ORDER — VANCOMYCIN HCL IN DEXTROSE 1-5 GM/200ML-% IV SOLN
1000.0000 mg | Freq: Once | INTRAVENOUS | Status: AC
Start: 2017-09-26 — End: 2017-09-26
  Administered 2017-09-26: 1000 mg via INTRAVENOUS
  Filled 2017-09-26: qty 200

## 2017-09-26 MED ORDER — SODIUM CHLORIDE 0.9 % IV SOLN
INTRAVENOUS | Status: DC
  Administered 2017-09-26: 100 mL/h via INTRAVENOUS
  Administered 2017-09-28: 05:00:00 via INTRAVENOUS

## 2017-09-26 MED ORDER — ACETAMINOPHEN 325 MG PO TABS
650.0000 mg | ORAL_TABLET | Freq: Four times a day (QID) | ORAL | Status: DC | PRN
  Administered 2017-09-26 – 2017-09-30 (×9): 650 mg via ORAL
  Filled 2017-09-26 (×9): qty 2

## 2017-09-26 MED ORDER — KETOROLAC TROMETHAMINE 15 MG/ML IJ SOLN
15.0000 mg | Freq: Once | INTRAMUSCULAR | Status: AC
  Administered 2017-09-26: 15 mg via INTRAVENOUS
  Filled 2017-09-26: qty 1

## 2017-09-26 MED ORDER — CEFTRIAXONE SODIUM 1 G IJ SOLR
1.0000 g | INTRAMUSCULAR | Status: DC
  Administered 2017-09-26 – 2017-09-29 (×4): 1 g via INTRAVENOUS
  Filled 2017-09-26 (×6): qty 10

## 2017-09-26 MED ORDER — VANCOMYCIN HCL IN DEXTROSE 750-5 MG/150ML-% IV SOLN: 750.0000 mg | INTRAVENOUS | Status: DC

## 2017-09-26 MED ORDER — VANCOMYCIN HCL IN DEXTROSE 750-5 MG/150ML-% IV SOLN
750.0000 mg | Freq: Once | INTRAVENOUS | Status: AC
  Administered 2017-09-26: 750 mg via INTRAVENOUS
  Filled 2017-09-26: qty 150

## 2017-09-26 MED ORDER — OXYCODONE HCL 5 MG PO TABS
10.0000 mg | ORAL_TABLET | Freq: Once | ORAL | Status: AC
  Administered 2017-09-26: 10 mg via ORAL
  Filled 2017-09-26: qty 2

## 2017-09-26 NOTE — H&P (Signed)
History and Physical    CLAUDETT BAYLY [name]:[id number] DOB: [birth year] DOA: 09/26/2017  PCP: Patient, No Pcp Per  Patient coming from: Home  Chief Complaint: Pain, swelling of R eye  HPI: Mia [name] is a 41 y.o. female with medical history significant of anxiety, bipolar who presents with 2-3 days hx of increased swelling, pain, redness surrounding R eye. Symptoms began with a "pimple" which patient attempted to squeeze. Shortly thereafter, area became more tender and swollen which eventually resulted in near closure of R eye. Patient subsequently presented to ED   ED Course: In the ED, pt noted to have WBC of 13.8. Facial CT was notable for moderate R periorbital and lateral facial soft tissue swelling without abscess or fluid collection. Findings were worrisome for cellulitis. Patient was given vancomycin. Blood cultures orderd. Hospitalist consulted for consideration for admission.  Review of Systems:  Review of Systems  Constitutional: Negative for chills, fever, malaise/fatigue and weight loss.  HENT: Negative for congestion, ear discharge, nosebleeds, sinus pain and tinnitus.   Eyes: Negative for double vision, photophobia, pain and discharge.  Respiratory: Negative for hemoptysis, sputum production and shortness of breath.   Cardiovascular: Negative for palpitations, orthopnea, claudication and leg swelling.  Gastrointestinal: Negative for abdominal pain, constipation, diarrhea, nausea and vomiting.  Genitourinary: Negative for frequency, hematuria and urgency.  Musculoskeletal: Negative for back pain, falls, joint pain and neck pain.  Skin: Positive for rash.  Neurological: Negative for tingling, tremors, focal weakness, seizures and loss of consciousness.  Psychiatric/Behavioral: Negative for hallucinations, memory loss and substance abuse.    Past Medical History:  Diagnosis Date  . Anxiety   . Bipolar 1 disorder (HCC)   . Cystic acne     Past Surgical  History:  Procedure Laterality Date  . CESAREAN SECTION       reports that she has been smoking cigarettes.  She has been smoking about 1.00 pack per day. she has never used smokeless tobacco. She reports that she does not drink alcohol or use drugs.  No Known Allergies  No family history on file.  When asked, patient states there are no significant medical isssues in her family  Prior to Admission medications   Medication Sig Start Date End Date Taking? Authorizing Provider  clonazePAM (KLONOPIN) 1 MG tablet Take 1 tablet (1 mg total) by mouth 2 (two) times daily. Patient not taking: Reported on 09/26/2017 03/03/16   Pucilowska, Ellin Goodie, MD  Guaifenesin 1200 MG TB12 Take 1 tablet (1,200 mg total) by mouth 2 (two) times daily. Patient not taking: Reported on 09/26/2017 07/24/16   Charlestine Night, PA-C  lamoTRIgine (LAMICTAL) 200 MG tablet Take 1 tablet (200 mg total) by mouth at bedtime. Patient not taking: Reported on 09/26/2017 03/03/16   Pucilowska, Ellin Goodie, MD  predniSONE (DELTASONE) 50 MG tablet Take 1 tablet (50 mg total) by mouth daily with breakfast. Patient not taking: Reported on 09/26/2017 07/24/16   Charlestine Night, PA-C  promethazine-dextromethorphan (PROMETHAZINE-DM) 6.25-15 MG/5ML syrup Take 5 mLs by mouth 4 (four) times daily as needed for cough. Patient not taking: Reported on 09/26/2017 07/24/16   Charlestine Night, PA-C  sulfamethoxazole-trimethoprim (BACTRIM DS,SEPTRA DS) 800-160 MG tablet Take 1 tablet by mouth every 12 (twelve) hours. Patient not taking: Reported on 09/26/2017 03/03/16   Pucilowska, Ellin Goodie, MD  traZODone (DESYREL) 100 MG tablet Take 1 tablet (100 mg total) by mouth at bedtime as needed for sleep. Patient not taking: Reported on 09/26/2017 03/03/16   Pucilowska, Braulio Conte B,  MD  ziprasidone (GEODON) 60 MG capsule Take 1 capsule (60 mg total) by mouth 2 (two) times daily with a meal. Patient not taking: Reported on 09/26/2017 03/03/16    Shari Prows, MD    Physical Exam: Vitals:   09/26/17 1106 09/26/17 1108 09/26/17 1435  BP: (!) 162/94  (!) 132/91  Pulse: 93  72  Resp: 16  19  Temp: 98.5 F (36.9 C)  98.6 F (37 C)  TempSrc: Oral  Oral  SpO2: 100%  97%  Weight:  74.8 kg (165 lb)   Height:  5\' 4"  (1.626 m)     Constitutional: NAD, calm, comfortable Vitals:   09/26/17 1106 09/26/17 1108 09/26/17 1435  BP: (!) 162/94  (!) 132/91  Pulse: 93  72  Resp: 16  19  Temp: 98.5 F (36.9 C)  98.6 F (37 C)  TempSrc: Oral  Oral  SpO2: 100%  97%  Weight:  74.8 kg (165 lb)   Height:  5\' 4"  (1.626 m)    Eyes: PERRL, lids and conjunctivae normal ENMT: Mucous membranes are moist. Posterior pharynx clear of any exudate or lesions.Normal dentition.  Neck: normal, supple, no masses, no thyromegaly Respiratory: clear to auscultation bilaterally, no wheezing, no crackles. Normal respiratory effort. No accessory muscle use.  Cardiovascular: Regular rate and rhythm, no murmurs / rubs / gallops. No extremity edema. 2+ pedal pulses. No carotid bruits.  Abdomen: no tenderness, no masses palpated. No hepatosplenomegaly. Bowel sounds positive.  Musculoskeletal: no clubbing / cyanosis. No joint deformity upper and lower extremities. Good ROM, no contractures. Normal muscle tone.  Skin: marked erythema and swelling surrounding R eye and face resulting in R eye closure Neurologic: CN 2-12 grossly intact. Sensation intact, DTR normal. Strength 5/5 in all 4.  Psychiatric: Normal judgment and insight. Alert and oriented x 3. Normal mood.    Labs on Admission: I have personally reviewed following labs and imaging studies  CBC: Recent Labs  Lab 09/26/17 1125  WBC 13.8*  NEUTROABS 10.9*  HGB 14.7  HCT 42.5  MCV 91.0  PLT 328   Basic Metabolic Panel: Recent Labs  Lab 09/26/17 1125  NA 135  K 5.0  CL 102  CO2 23  GLUCOSE 117*  BUN 5*  CREATININE 0.71  CALCIUM 8.9   GFR: Estimated Creatinine Clearance: 92.5  mL/min (by C-G formula based on SCr of 0.71 mg/dL). Liver Function Tests: No results for input(s): AST, ALT, ALKPHOS, BILITOT, PROT, ALBUMIN in the last 168 hours. No results for input(s): LIPASE, AMYLASE in the last 168 hours. No results for input(s): AMMONIA in the last 168 hours. Coagulation Profile: No results for input(s): INR, PROTIME in the last 168 hours. Cardiac Enzymes: No results for input(s): CKTOTAL, CKMB, CKMBINDEX, TROPONINI in the last 168 hours. BNP (last 3 results) No results for input(s): PROBNP in the last 8760 hours. HbA1C: No results for input(s): HGBA1C in the last 72 hours. CBG: No results for input(s): GLUCAP in the last 168 hours. Lipid Profile: No results for input(s): CHOL, HDL, LDLCALC, TRIG, CHOLHDL, LDLDIRECT in the last 72 hours. Thyroid Function Tests: No results for input(s): TSH, T4TOTAL, FREET4, T3FREE, THYROIDAB in the last 72 hours. Anemia Panel: No results for input(s): VITAMINB12, FOLATE, FERRITIN, TIBC, IRON, RETICCTPCT in the last 72 hours. Urine analysis:    Component Value Date/Time   COLORURINE YELLOW 02/28/2016 1536   APPEARANCEUR TURBID (A) 02/28/2016 1536   LABSPEC 1.023 02/28/2016 1536   PHURINE 6.5 02/28/2016 1536   GLUCOSEU  NEGATIVE 02/28/2016 1536   HGBUR MODERATE (A) 02/28/2016 1536   BILIRUBINUR NEGATIVE 02/28/2016 1536   KETONESUR NEGATIVE 02/28/2016 1536   PROTEINUR NEGATIVE 02/28/2016 1536   NITRITE POSITIVE (A) 02/28/2016 1536   LEUKOCYTESUR LARGE (A) 02/28/2016 1536   Sepsis Labs: !!!!!!!!!!!!!!!!!!!!!!!!!!!!!!!!!!!!!!!!!!!! @LABRCNTIP (procalcitonin:4,lacticidven:4) )No results found for this or any previous visit (from the past 240 hour(s)).   Radiological Exams on Admission: Ct Soft Tissue Neck W Contrast  Result Date: 09/26/2017 CLINICAL DATA:  Facial edema involving the right periorbital soft tissues. EXAM: CT MAXILLOFACIAL WITH CONTRAST; CT NECK WITH CONTRAST TECHNIQUE: Multidetector CT imaging of the neck and  maxillofacial structures was performed with intravenous contrast. Multiplanar CT image reconstructions were also generated. CONTRAST:  75mL ISOVUE-300 IOPAMIDOL (ISOVUE-300) INJECTION 61% COMPARISON:  None. FINDINGS: CT MAXILLOFACIAL FINDINGS Osseous:  No fracture or other osseous abnormality. Orbits: There is moderate right periorbital soft tissue swelling without involvement of the postseptal structures. Optic nerve and extraocular muscles are normal. The globes are intact. Normal lacrimal apparatus. Left orbit is normal. Sinuses: Minimal bilateral maxillary mucosal thickening. No fluid levels. Mastoid air cells are clear. No middle ear effusion. Asymmetric aeration of the petrous apices, a normal variant. Soft tissues: Large amounts of lateral right facial and periorbital soft tissue swelling without abscess or drainable fluid collection. Inflammatory change extends to the subcutaneous fat but no involvement of the deep structures of the face. Limited intracranial: Normal. CT NECK FINDINGS Pharynx and larynx: --Nasopharynx: Fossae of Rosenmuller are clear. Normal adenoid tonsils for age. --Oral cavity and oropharynx: The palatine and lingual tonsils are normal. The visible oral cavity and floor of mouth are normal. --Hypopharynx: Normal vallecula and pyriform sinuses. --Larynx: Normal epiglottis and pre-epiglottic space. Normal aryepiglottic and vocal folds. --Retropharyngeal space: No abscess, effusion or lymphadenopathy. Salivary glands: --Parotid: No mass lesion or inflammation. No sialolithiasis or ductal dilatation. --Submandibular: Symmetric without inflammation. No sialolithiasis or ductal dilatation. --Sublingual: Normal. No ranula or other visible lesion of the base of tongue and floor of mouth. Thyroid: Normal. Lymph nodes: There are bilateral enlarged cervical lymph nodes. A right level IIa node measures 12 mm. A left level IIa node measures 10 mm. There are numerous bilateral level 5A lymph nodes that  measure up to 9 mm. No abnormal density nodes. Vascular: Major cervical vessels are patent. Skeleton: No bony spinal canal stenosis. No lytic or blastic lesions. Upper chest: Ground-glass opacities in the left upper lobe. Visualization limited by photon starvation artifact due to body habitus. Other: None. IMPRESSION: 1. Moderate right periorbital and lateral facial soft tissue swelling extending to the subcutaneous fat without involvement of the deep facial structures, consistent with cellulitis. No abscess or fluid collection. No intraorbital extension. 2. Bilateral reactive level 2A and 5A cervical lymphadenopathy. 3. Ground-glass opacities in the anterior left upper lobe are poorly visualized, but could represent developing infection. Correlate with dedicated chest radiograph. Electronically Signed   By: Deatra RobinsonKevin  Herman M.D.   On: 09/26/2017 14:06   Ct Maxillofacial W Contrast  Result Date: 09/26/2017 CLINICAL DATA:  Facial edema involving the right periorbital soft tissues. EXAM: CT MAXILLOFACIAL WITH CONTRAST; CT NECK WITH CONTRAST TECHNIQUE: Multidetector CT imaging of the neck and maxillofacial structures was performed with intravenous contrast. Multiplanar CT image reconstructions were also generated. CONTRAST:  75mL ISOVUE-300 IOPAMIDOL (ISOVUE-300) INJECTION 61% COMPARISON:  None. FINDINGS: CT MAXILLOFACIAL FINDINGS Osseous:  No fracture or other osseous abnormality. Orbits: There is moderate right periorbital soft tissue swelling without involvement of the postseptal structures. Optic nerve and  extraocular muscles are normal. The globes are intact. Normal lacrimal apparatus. Left orbit is normal. Sinuses: Minimal bilateral maxillary mucosal thickening. No fluid levels. Mastoid air cells are clear. No middle ear effusion. Asymmetric aeration of the petrous apices, a normal variant. Soft tissues: Large amounts of lateral right facial and periorbital soft tissue swelling without abscess or drainable  fluid collection. Inflammatory change extends to the subcutaneous fat but no involvement of the deep structures of the face. Limited intracranial: Normal. CT NECK FINDINGS Pharynx and larynx: --Nasopharynx: Fossae of Rosenmuller are clear. Normal adenoid tonsils for age. --Oral cavity and oropharynx: The palatine and lingual tonsils are normal. The visible oral cavity and floor of mouth are normal. --Hypopharynx: Normal vallecula and pyriform sinuses. --Larynx: Normal epiglottis and pre-epiglottic space. Normal aryepiglottic and vocal folds. --Retropharyngeal space: No abscess, effusion or lymphadenopathy. Salivary glands: --Parotid: No mass lesion or inflammation. No sialolithiasis or ductal dilatation. --Submandibular: Symmetric without inflammation. No sialolithiasis or ductal dilatation. --Sublingual: Normal. No ranula or other visible lesion of the base of tongue and floor of mouth. Thyroid: Normal. Lymph nodes: There are bilateral enlarged cervical lymph nodes. A right level IIa node measures 12 mm. A left level IIa node measures 10 mm. There are numerous bilateral level 5A lymph nodes that measure up to 9 mm. No abnormal density nodes. Vascular: Major cervical vessels are patent. Skeleton: No bony spinal canal stenosis. No lytic or blastic lesions. Upper chest: Ground-glass opacities in the left upper lobe. Visualization limited by photon starvation artifact due to body habitus. Other: None. IMPRESSION: 1. Moderate right periorbital and lateral facial soft tissue swelling extending to the subcutaneous fat without involvement of the deep facial structures, consistent with cellulitis. No abscess or fluid collection. No intraorbital extension. 2. Bilateral reactive level 2A and 5A cervical lymphadenopathy. 3. Ground-glass opacities in the anterior left upper lobe are poorly visualized, but could represent developing infection. Correlate with dedicated chest radiograph. Electronically Signed   By: Deatra RobinsonKevin  Herman  M.D.   On: 09/26/2017 14:06    Assessment/Plan Principal Problem:   Periorbital cellulitis of right eye Active Problems:   Opioid use disorder, moderate, dependence (HCC)   Bipolar 1 disorder, mixed, moderate (HCC)   1. Right periorbital cellulitis with sepsis present on presentation 1. CT scan personally reviewed.  Patient has findings consistent with moderate right sided periorbital cellulitis. 2. Currently afebrile with a mild leukocytosis of just under 14,000. 3. She does have elevated lactate of over 2.  Will continue patient on basal IV fluids.  Pete lactate in the morning 4. Continue patient on empiric vancomycin and Rocephin 2. Opioid dependency 1. Appears stable at this time. 3. Bipolar 1 disorder 1. Appears stable at this time.  Not taking meds at this time  DVT prophylaxis: Lovenox   Code Status: full Family Communication: Pt in room  Disposition Plan: Uncertain at this time  Consults called:  Admission status: Inpatient as it would likely require greater than 2 midnight stay for IV vancomycin and Rocephin for sepsis with periorbital cellulitis  Rickey BarbaraStephen Chiu MD [facility] Pager [phone]  If 7PM-7AM, please contact night-coverage [email or web address] Password [email or web address]  09/26/2017, 2:50 PM

## 2017-09-26 NOTE — ED Notes (Signed)
Bed: WTR5 Expected date:  Expected time:  Means of arrival:  Comments: 

## 2017-09-26 NOTE — ED Notes (Signed)
Assigned 15:34 @ 15:51 call report @ 16:11

## 2017-09-26 NOTE — ED Notes (Signed)
Patient transported to CT 

## 2017-09-26 NOTE — ED Triage Notes (Signed)
Per EMS-Patient reports that she had some acne on the side of her right eye and tried to pop the pimple. [facility] Today, patient has increased swelling and pain to the right eye and face.

## 2017-09-26 NOTE — Progress Notes (Addendum)
Pharmacy Antibiotic Note  Paula Comptonabitha A Downen is a 41 y.o. female admitted on 09/26/2017 with right periorbital cellulitis with sepsis.  Pharmacy has been consulted for vancomycin dosing.  Vancomycin 1g x 1 given in ED already.  SCr WNL.  Plan: Vancomycin 750 mg IV x 1 now (in addition to 1g given in ED will provide an adequate loading dose). Tomorrow, begin maintenance dose of vancomycin 750 mg IV q24h for goal AUC 400-500. F/u SCr.  Height: 5\' 4"  (162.6 cm) Weight: 165 lb (74.8 kg) IBW/kg (Calculated) : 54.7  Temp (24hrs), Avg:98.6 F (37 C), Min:98.5 F (36.9 C), Max:98.6 F (37 C)  Recent Labs  Lab 09/26/17 1125 09/26/17 1204  WBC 13.8*  --   CREATININE 0.71  --   LATICACIDVEN  --  2.39*    Estimated Creatinine Clearance: 92.5 mL/min (by C-G formula based on SCr of 0.71 mg/dL).    No Known Allergies  Antimicrobials this admission: 12/10 Ceftriaxone >>  12/10 Vancomcyin >>   Dose adjustments this admission:  Microbiology results: 12/10 BCx:   Thank you for allowing pharmacy to be a part of this patient's care.  Clance BollRunyon, Yara Tomkinson 09/26/2017 3:11 PM

## 2017-09-26 NOTE — ED Provider Notes (Signed)
[facility]-EMERGENCY DEPT Provider Note   CSN: [id number] Arrival date & time: 09/26/17  1056     History   Chief Complaint Chief Complaint  Patient presents with  . Facial Swelling    HPI Mia [name] is a 41 y.o. female with past medical history of anxiety, bipolar 1 disorder, cystic acne presenting via EMS with progressive onset right-sided facial pain, swelling, erythema worsening over the last 3 days.  Patient reports that she noticed a small pimple to the right temple that she attempted to extract but the erythema and edema subsequently spread to her right eye.  Reports trying ice without success but she could not keep it on her face.  She has not tried anything else for pain or symptoms.  Denies fever, chills, does report nausea but no vomiting.  Patient reports that she is not taking any medications at this time.  [facility] but left without being seen.  She reports that since then it has worsened.  With no visual disturbances, pain with extraocular motion or eye pain.  She states that her pain is on the surface eyelids and right temple where the erythema and swelling is located.  HPI  Past Medical History:  Diagnosis Date  . Anxiety   . Bipolar 1 disorder (HCC)   . Cystic acne     Patient Active Problem List   Diagnosis Date Noted  . Suicidal ideation 03/01/2016  . UTI (urinary tract infection) 03/01/2016  . Bipolar 1 disorder, mixed, moderate (HCC)   . Opioid use disorder, moderate, dependence (HCC) 02/28/2016  . 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 Substance induced mood disorder (HCC) 02/28/2016    Past Surgical History:  Procedure Laterality Date  . CESAREAN SECTION      OB History    No data available       Home Medications    Prior to Admission medications   Medication Sig Start Date End Date Taking? Authorizing Provider  clonazePAM  (KLONOPIN) 1 MG tablet Take 1 tablet (1 mg total) by mouth 2 (two) times daily. Patient not taking: Reported on 09/26/2017 03/03/16   Pucilowska, Ellin GoodieJolanta B, MD  Guaifenesin 1200 MG TB12 Take 1 tablet (1,200 mg total) by mouth 2 (two) times daily. Patient not taking: Reported on 09/26/2017 07/24/16   Charlestine NightLawyer, Christopher, PA-C  lamoTRIgine (LAMICTAL) 200 MG tablet Take 1 tablet (200 mg total) by mouth at bedtime. Patient not taking: Reported on 09/26/2017 03/03/16   Pucilowska, Ellin GoodieJolanta B, MD  predniSONE (DELTASONE) 50 MG tablet Take 1 tablet (50 mg total) by mouth daily with breakfast. Patient not taking: Reported on 09/26/2017 07/24/16   Charlestine NightLawyer, Christopher, PA-C  promethazine-dextromethorphan (PROMETHAZINE-DM) 6.25-15 MG/5ML syrup Take 5 mLs by mouth 4 (four) times daily as needed for cough. Patient not taking: Reported on 09/26/2017 07/24/16   Charlestine NightLawyer, Christopher, PA-C  sulfamethoxazole-trimethoprim (BACTRIM DS,SEPTRA DS) 800-160 MG tablet Take 1 tablet by mouth every 12 (twelve) hours. Patient not taking: Reported on 09/26/2017 03/03/16   Pucilowska, Ellin GoodieJolanta B, MD  traZODone (DESYREL) 100 MG tablet Take 1 tablet (100 mg total) by mouth at bedtime as needed for sleep. Patient not taking: Reported on 09/26/2017 03/03/16   Pucilowska, Braulio ConteJolanta B, MD  ziprasidone (GEODON) 60 MG capsule Take 1 capsule (60 mg total) by mouth 2 (two) times daily with a meal. Patient not taking: Reported on 09/26/2017 03/03/16   Shari ProwsPucilowska, Jolanta B, MD  Family History No family history on file.  Social History Social History   Tobacco Use  . Smoking status: Current Some Day Smoker    Packs/day: 1.00    Types: Cigarettes  . Smokeless tobacco: Never Used  Substance Use Topics  . Alcohol use: No  . Drug use: No     Allergies   Patient has no known allergies.   Review of Systems Review of Systems  Constitutional: Negative for chills, diaphoresis and fever.  HENT: Positive for facial swelling. Negative for  congestion, ear pain, sinus pressure, sinus pain, sore throat, tinnitus, trouble swallowing and voice change.   Eyes: Positive for discharge. Negative for photophobia, pain, redness, itching and visual disturbance.       Periorbital swelling and erythema  Respiratory: Negative for cough, shortness of breath, wheezing and stridor.   Cardiovascular: Negative for chest pain and palpitations.  Gastrointestinal: Positive for nausea. Negative for abdominal pain and vomiting.  Genitourinary: Negative for dysuria and hematuria.  Musculoskeletal: Positive for myalgias. Negative for arthralgias, back pain, neck pain and neck stiffness.  Skin: Positive for color change and wound. Negative for pallor and rash.       Draining abscess to the right temple  Neurological: Negative for dizziness, seizures, syncope, weakness, light-headedness and headaches.     Physical Exam Updated Vital Signs BP (!) 132/91   Pulse 72   Temp 98.6 F (37 C) (Oral)   Resp 19   Ht 5\' 4"  (1.626 m)   Wt 74.8 kg (165 lb)   LMP 09/20/2017 Comment: Neg preg test 09/26/17  SpO2 97%   BMI 28.32 kg/m   Physical Exam  Constitutional: She appears well-developed and well-nourished. No distress.  Afebrile, nontoxic lying in bed in apparent discomfort and nervous  HENT:  Head: Normocephalic and atraumatic.  Erythema, edema, induration and draining abscess to the right temporal region extending to the right periorbital area  Eyes: Conjunctivae and EOM are normal. Pupils are equal, round, and reactive to light. Right eye exhibits discharge. Left eye exhibits no discharge. No scleral icterus.  Right periorbital edema and erythema.  Patient is able to open her eye and has no visual disturbances.  No erythema to the eye or pain with extraocular motion.  Normal EOM.   Neck: Normal range of motion. Neck supple.  Cardiovascular: Normal rate, regular rhythm and normal heart sounds.  No murmur heard. Pulmonary/Chest: Effort normal and  breath sounds normal. No stridor. No respiratory distress. She has no wheezes. She has no rales.  Abdominal: She exhibits no distension. There is no tenderness.  Musculoskeletal: Normal range of motion. She exhibits edema and tenderness. She exhibits no deformity.  Neurological: She is alert.  Skin: Skin is warm and dry. No rash noted. She is not diaphoretic. There is erythema. No pallor.  Nursing note and vitals reviewed.    ED Treatments / Results  Labs (all labs ordered are listed, but only abnormal results are displayed) Labs Reviewed  CBC WITH DIFFERENTIAL/PLATELET - Abnormal; Notable for the following components:      Result Value   WBC 13.8 (*)    Neutro Abs 10.9 (*)    All other components within normal limits  BASIC METABOLIC PANEL - Abnormal; Notable for the following components:   Glucose, Bld 117 (*)    BUN 5 (*)    All other components within normal limits  I-STAT CG4 LACTIC ACID, ED - Abnormal; Notable for the following components:   Lactic Acid, Venous 2.39 (*)  All other components within normal limits  CULTURE, BLOOD (ROUTINE X 2)  CULTURE, BLOOD (ROUTINE X 2)  I-STAT BETA HCG BLOOD, ED ([facility], [facility], [facility] ONLY)  I-STAT CG4 LACTIC ACID, ED    EKG  EKG Interpretation None       Radiology Ct Soft Tissue Neck W Contrast  Result Date: 09/26/2017 CLINICAL DATA:  Facial edema involving the right periorbital soft tissues. EXAM: CT MAXILLOFACIAL WITH CONTRAST; CT NECK WITH CONTRAST TECHNIQUE: Multidetector CT imaging of the neck and maxillofacial structures was performed with intravenous contrast. Multiplanar CT image reconstructions were also generated. CONTRAST:  75mL ISOVUE-300 IOPAMIDOL (ISOVUE-300) INJECTION 61% COMPARISON:  None. FINDINGS: CT MAXILLOFACIAL FINDINGS Osseous:  No fracture or other osseous abnormality. Orbits: There is moderate right periorbital soft tissue swelling without involvement of the postseptal structures. Optic nerve and extraocular muscles  are normal. The globes are intact. Normal lacrimal apparatus. Left orbit is normal. Sinuses: Minimal bilateral maxillary mucosal thickening. No fluid levels. Mastoid air cells are clear. No middle ear effusion. Asymmetric aeration of the petrous apices, a normal variant. Soft tissues: Large amounts of lateral right facial and periorbital soft tissue swelling without abscess or drainable fluid collection. Inflammatory change extends to the subcutaneous fat but no involvement of the deep structures of the face. Limited intracranial: Normal. CT NECK FINDINGS Pharynx and larynx: --Nasopharynx: Fossae of Rosenmuller are clear. Normal adenoid tonsils for age. --Oral cavity and oropharynx: The palatine and lingual tonsils are normal. The visible oral cavity and floor of mouth are normal. --Hypopharynx: Normal vallecula and pyriform sinuses. --Larynx: Normal epiglottis and pre-epiglottic space. Normal aryepiglottic and vocal folds. --Retropharyngeal space: No abscess, effusion or lymphadenopathy. Salivary glands: --Parotid: No mass lesion or inflammation. No sialolithiasis or ductal dilatation. --Submandibular: Symmetric without inflammation. No sialolithiasis or ductal dilatation. --Sublingual: Normal. No ranula or other visible lesion of the base of tongue and floor of mouth. Thyroid: Normal. Lymph nodes: There are bilateral enlarged cervical lymph nodes. A right level IIa node measures 12 mm. A left level IIa node measures 10 mm. There are numerous bilateral level 5A lymph nodes that measure up to 9 mm. No abnormal density nodes. Vascular: Major cervical vessels are patent. Skeleton: No bony spinal canal stenosis. No lytic or blastic lesions. Upper chest: Ground-glass opacities in the left upper lobe. Visualization limited by photon starvation artifact due to body habitus. Other: None. IMPRESSION: 1. Moderate right periorbital and lateral facial soft tissue swelling extending to the subcutaneous fat without involvement  of the deep facial structures, consistent with cellulitis. No abscess or fluid collection. No intraorbital extension. 2. Bilateral reactive level 2A and 5A cervical lymphadenopathy. 3. Ground-glass opacities in the anterior left upper lobe are poorly visualized, but could represent developing infection. Correlate with dedicated chest radiograph. Electronically Signed   By: Deatra Robinson M.D.   On: 09/26/2017 14:06   Ct Maxillofacial W Contrast  Result Date: 09/26/2017 CLINICAL DATA:  Facial edema involving the right periorbital soft tissues. EXAM: CT MAXILLOFACIAL WITH CONTRAST; CT NECK WITH CONTRAST TECHNIQUE: Multidetector CT imaging of the neck and maxillofacial structures was performed with intravenous contrast. Multiplanar CT image reconstructions were also generated. CONTRAST:  75mL ISOVUE-300 IOPAMIDOL (ISOVUE-300) INJECTION 61% COMPARISON:  None. FINDINGS: CT MAXILLOFACIAL FINDINGS Osseous:  No fracture or other osseous abnormality. Orbits: There is moderate right periorbital soft tissue swelling without involvement of the postseptal structures. Optic nerve and extraocular muscles are normal. The globes are intact. Normal lacrimal apparatus. Left orbit is normal. Sinuses: Minimal bilateral maxillary  mucosal thickening. No fluid levels. Mastoid air cells are clear. No middle ear effusion. Asymmetric aeration of the petrous apices, a normal variant. Soft tissues: Large amounts of lateral right facial and periorbital soft tissue swelling without abscess or drainable fluid collection. Inflammatory change extends to the subcutaneous fat but no involvement of the deep structures of the face. Limited intracranial: Normal. CT NECK FINDINGS Pharynx and larynx: --Nasopharynx: Fossae of Rosenmuller are clear. Normal adenoid tonsils for age. --Oral cavity and oropharynx: The palatine and lingual tonsils are normal. The visible oral cavity and floor of mouth are normal. --Hypopharynx: Normal vallecula and pyriform  sinuses. --Larynx: Normal epiglottis and pre-epiglottic space. Normal aryepiglottic and vocal folds. --Retropharyngeal space: No abscess, effusion or lymphadenopathy. Salivary glands: --Parotid: No mass lesion or inflammation. No sialolithiasis or ductal dilatation. --Submandibular: Symmetric without inflammation. No sialolithiasis or ductal dilatation. --Sublingual: Normal. No ranula or other visible lesion of the base of tongue and floor of mouth. Thyroid: Normal. Lymph nodes: There are bilateral enlarged cervical lymph nodes. A right level IIa node measures 12 mm. A left level IIa node measures 10 mm. There are numerous bilateral level 5A lymph nodes that measure up to 9 mm. No abnormal density nodes. Vascular: Major cervical vessels are patent. Skeleton: No bony spinal canal stenosis. No lytic or blastic lesions. Upper chest: Ground-glass opacities in the left upper lobe. Visualization limited by photon starvation artifact due to body habitus. Other: None. IMPRESSION: 1. Moderate right periorbital and lateral facial soft tissue swelling extending to the subcutaneous fat without involvement of the deep facial structures, consistent with cellulitis. No abscess or fluid collection. No intraorbital extension. 2. Bilateral reactive level 2A and 5A cervical lymphadenopathy. 3. Ground-glass opacities in the anterior left upper lobe are poorly visualized, but could represent developing infection. Correlate with dedicated chest radiograph. Electronically Signed   By: Deatra RobinsonKevin  Herman M.D.   On: 09/26/2017 14:06    Procedures Procedures (including critical care time)  Medications Ordered in ED Medications  ketorolac (TORADOL) 15 MG/ML injection 15 mg (15 mg Intravenous Given 09/26/17 1201)  fentaNYL (SUBLIMAZE) injection 50 mcg (50 mcg Intravenous Given 09/26/17 1201)  ondansetron (ZOFRAN) injection 4 mg (4 mg Intravenous Given 09/26/17 1201)  sodium chloride 0.9 % bolus 500 mL (0 mLs Intravenous Stopped 09/26/17  1432)  sodium chloride 0.9 % bolus 1,000 mL (1,000 mLs Intravenous New Bag/Given 09/26/17 1324)  vancomycin (VANCOCIN) IVPB 1000 mg/200 mL premix (0 mg Intravenous Stopped 09/26/17 1433)  iopamidol (ISOVUE-300) 61 % injection (75 mLs Intravenous Contrast Given 09/26/17 1313)  fentaNYL (SUBLIMAZE) injection 50 mcg (50 mcg Intravenous Given 09/26/17 1427)     Initial Impression / Assessment and Plan / ED Course  I have reviewed the triage vital signs and the nursing notes.  Pertinent labs & imaging results that were available during my care of the patient were reviewed by me and considered in my medical decision making (see chart for details).    Patient presenting with progressive onset worsening right-sided facial cellulitis, draining abscess and periorbital edema.  Will treat patient's pain and nausea and obtain advanced imaging to evaluate for abscess and reassess  Patient is otherwise afebrile, nontoxic. Labs show leukocytosis, elevated lactate  On reassessment, patient reported improvement in pain.  CT maxillofacial without orbital involvement or deep tissue abscess.  Reactive lymphadenopathy noted  Patient has been non-compliant with medications, without PCP and doesn't appear reliable for outpatient treatment.  Will call hospitalist for admission. Patient was discussed with Dr. Jeraldine LootsLockwood who has  seen patient and agrees with assessment and plan. Spoke to hospitalist Dr. Rhona Leavens who will be admitting patient.  Final Clinical Impressions(s) / ED Diagnoses   Final diagnoses:  Periorbital cellulitis of right eye    ED Discharge Orders    None       Gregary Cromer 09/26/17 2340    Gerhard Munch, MD 09/27/17 1719

## 2017-09-27 LAB — CBC
HEMATOCRIT: 37.8 % (ref 36.0–46.0)
HEMOGLOBIN: 12.6 g/dL (ref 12.0–15.0)
MCH: 30.5 pg (ref 26.0–34.0)
MCHC: 33.3 g/dL (ref 30.0–36.0)
MCV: 91.5 fL (ref 78.0–100.0)
Platelets: 260 10*3/uL (ref 150–400)
RBC: 4.13 MIL/uL (ref 3.87–5.11)
RDW: 13.8 % (ref 11.5–15.5)
WBC: 11.5 10*3/uL — ABNORMAL HIGH (ref 4.0–10.5)

## 2017-09-27 LAB — COMPREHENSIVE METABOLIC PANEL
ALBUMIN: 2.8 g/dL — AB (ref 3.5–5.0)
ALK PHOS: 141 U/L — AB (ref 38–126)
ALT: 52 U/L (ref 14–54)
ANION GAP: 7 (ref 5–15)
AST: 46 U/L — ABNORMAL HIGH (ref 15–41)
BUN: 6 mg/dL (ref 6–20)
CALCIUM: 8 mg/dL — AB (ref 8.9–10.3)
CHLORIDE: 105 mmol/L (ref 101–111)
CO2: 24 mmol/L (ref 22–32)
Creatinine, Ser: 0.48 mg/dL (ref 0.44–1.00)
GFR calc non Af Amer: >60 mL/min (ref >60)
GLUCOSE: 115 mg/dL — AB (ref 65–99)
POTASSIUM: 3.4 mmol/L — AB (ref 3.5–5.1)
SODIUM: 136 mmol/L (ref 135–145)
Total Bilirubin: 0.6 mg/dL (ref 0.3–1.2)
Total Protein: 6.7 g/dL (ref 6.5–8.1)

## 2017-09-27 MED ORDER — MORPHINE SULFATE (PF) 2 MG/ML IV SOLN
2.0000 mg | INTRAVENOUS | Status: DC | PRN
  Administered 2017-09-27 – 2017-09-28 (×5): 2 mg via INTRAVENOUS
  Filled 2017-09-27 (×6): qty 1

## 2017-09-27 MED ORDER — MORPHINE SULFATE (PF) 2 MG/ML IV SOLN
2.0000 mg | Freq: Once | INTRAVENOUS | Status: AC
  Administered 2017-09-27: 2 mg via INTRAVENOUS
  Filled 2017-09-27: qty 1

## 2017-09-27 MED ORDER — VANCOMYCIN HCL IN DEXTROSE 750-5 MG/150ML-% IV SOLN
750.0000 mg | Freq: Two times a day (BID) | INTRAVENOUS | Status: DC
  Administered 2017-09-27 – 2017-09-30 (×6): 750 mg via INTRAVENOUS
  Filled 2017-09-27 (×6): qty 150

## 2017-09-27 NOTE — Progress Notes (Signed)
Pharmacy Antibiotic Note  Mia [name] is a 41 y.o. female admitted on 09/26/2017 with right periorbital cellulitis with sepsis.  Pharmacy was consulted for vancomycin dosing.   Received vancomycin 1750 mg as total loading dose yesterday.  Current vancomycin dosage on profile is 750 mg q24h.  Also receiving ceftriaxone 1 gram IV q24h.  SCr remains WNL. Leukocytosis improving Remains afebrile.   Plan: 1. Increase vancomycin to 750 mg IV q12h.  (Used yesterday's SCr value of 0.71, since today's value of 0.48 might reflect underproduction of creatinine).   Calculated AUC 460, goal AUC 400-500. 2. Continue ceftriaxone 1 gram IV q24h as ordered. 3. Follow SCr, clinical course.   Height: 5\' 4"  (162.6 cm) Weight: 165 lb (74.8 kg) IBW/kg (Calculated) : 54.7  Temp (24hrs), Avg:98.3 F (36.8 C), Min:97.8 F (36.6 C), Max:98.6 F (37 C)  Recent Labs  Lab 09/26/17 1125 09/26/17 1204 09/26/17 1511 09/27/17 0602  WBC 13.8*  --   --  11.5*  CREATININE 0.71  --   --  0.48  LATICACIDVEN  --  2.39* 0.63  --     Estimated Creatinine Clearance: 92.5 mL/min (by C-G formula based on SCr of 0.48 mg/dL).    No Known Allergies  Antimicrobials this admission: 12/10 Ceftriaxone >>  12/10 Vancomcyin >>   Dose adjustments this admission: 12/11 Increased vancomycin from 750 mg q24h to 750 mg q12h.  Microbiology results: 12/10 BCx: collected  Thank you for allowing pharmacy to be a part of this patient's care.  Elie Goodyandy Charliegh Vasudevan, PharmD, BCPS Pager: [phone] 09/27/2017  8:35 AM

## 2017-09-27 NOTE — Progress Notes (Signed)
Patient c/o severe pain. Pain medication not due until 1200 noon. Dr Lowell GuitarPowell paged to make him aware. Lina SarBeth Dylon Correa, RN

## 2017-09-27 NOTE — Progress Notes (Signed)
PROGRESS NOTE    Mia [name]  WUJ:[id number] DOB: 06-20-1976 DOA: 09/26/2017 PCP: Patient, No Pcp Per    Brief Narrative:  DARBY FLEEMAN is Mia [name] 42 y.o. female with medical history significant of anxiety, bipolar who presents with 2-3 days hx of increased swelling, pain, redness surrounding R eye. Symptoms began with Sun Wilensky "pimple" which patient attempted to squeeze. Shortly thereafter, area became more tender and swollen which eventually resulted in near closure of R eye. Patient subsequently presented to ED   Assessment & Plan:   Principal Problem:   Periorbital cellulitis of right eye Active Problems:   Opioid use disorder, moderate, dependence (HCC)   Bipolar 1 disorder, mixed, moderate (HCC)   Periorbital cellulitis    1. Right periorbital cellulitis with sepsis present on presentation 1. CT scan personally reviewed.  Patient has findings consistent with moderate right sided periorbital cellulitis. 2. Currently afebrile with Alquan Morrish mild leukocytosis of just under 14,000. 3. She does have elevated lactate of over 2.  Will continue patient on basal IV fluids.  Pete lactate in the morning 4. Continue patient on empiric vancomycin and Rocephin 2. Opioid dependency 1. Appears stable at this time. 3. Bipolar 1 disorder 1. Appears stable at this time.  Not taking meds at this time  DVT prophylaxis: lovenox Code Status: full Family Communication: none at bedside Disposition Plan: pending improvement   Consultants:   none  Procedures: (Don't include imaging studies which can be auto populated. Include things that cannot be auto populated i.e. Echo, Carotid and venous dopplers, Foley, Bipap, HD, tubes/drains, wound vac, central lines etc)  none  Antimicrobials: (specify start and planned stop date. Auto populated tables are space occupying and do not give end dates) Anti-infectives (From admission, onward)   Start     Dose/Rate Route Frequency Ordered Stop   09/27/17 1600   vancomycin (VANCOCIN) IVPB 750 mg/150 ml premix  Status:  Discontinued     750 mg 150 mL/hr over 60 Minutes Intravenous Every 24 hours 09/26/17 1511 09/27/17 0827   09/27/17 1200  vancomycin (VANCOCIN) IVPB 750 mg/150 ml premix     750 mg 150 mL/hr over 60 Minutes Intravenous Every 12 hours 09/27/17 0827     09/26/17 1600  cefTRIAXone (ROCEPHIN) 1 g in dextrose 5 % 50 mL IVPB     1 g 100 mL/hr over 30 Minutes Intravenous Every 24 hours 09/26/17 1501     09/26/17 1530  vancomycin (VANCOCIN) IVPB 750 mg/150 ml premix     750 mg 150 mL/hr over 60 Minutes Intravenous  Once 09/26/17 1511 09/26/17 1804   09/26/17 1245  vancomycin (VANCOCIN) IVPB 1000 mg/200 mL premix     1,000 mg 200 mL/hr over 60 Minutes Intravenous  Once 09/26/17 1232 09/26/17 1433       Subjective: Pain at R side of face.   Started as Gari Hartsell cyst.  Objective: Vitals:   09/26/17 1435 09/26/17 1625 09/26/17 2253 09/27/17 0535  BP: (!) 132/91 (!) 158/98 (!) 148/90 (!) 156/98  Pulse: 72 84 92 81  Resp: 19  20 20   Temp: 98.6 F (37 C) 98.4 F (36.9 C) 97.8 F (36.6 C) 98.3 F (36.8 C)  TempSrc: Oral Oral Oral Oral  SpO2: 97% 100% 100% 100%  Weight:      Height:        Intake/Output Summary (Last 24 hours) at 09/27/2017 1104 Last data filed at 09/27/2017 0545 Gross per 24 hour  Intake 3438.33 ml  Output 500 ml  Net 2938.33 ml   Filed Weights   09/26/17 1108  Weight: 74.8 kg (165 lb)    Examination:  General exam: Appears calm and comfortable  Respiratory system: Clear to auscultation. Respiratory effort normal. Cardiovascular system: S1 & S2 heard, RRR. No JVD, murmurs, rubs, gallops or clicks. No pedal edema. Gastrointestinal system: Abdomen is nondistended, soft and nontender. No organomegaly or masses felt. Normal bowel sounds heard. Central nervous system: Alert and oriented. No focal neurological deficits. Extremities: Symmetric 5 x 5 power. Skin: erythema and swelling surrounding R eye spreading to  preauricular Psychiatry: Judgement and insight appear normal. Mood & affect appropriate.       Data Reviewed: I have personally reviewed following labs and imaging studies  CBC: Recent Labs  Lab 09/26/17 1125 09/27/17 0602  WBC 13.8* 11.5*  NEUTROABS 10.9*  --   HGB 14.7 12.6  HCT 42.5 37.8  MCV 91.0 91.5  PLT 328 260   Basic Metabolic Panel: Recent Labs  Lab 09/26/17 1125 09/27/17 0602  NA 135 136  K 5.0 3.4*  CL 102 105  CO2 23 24  GLUCOSE 117* 115*  BUN 5* 6  CREATININE 0.71 0.48  CALCIUM 8.9 8.0*   GFR: Estimated Creatinine Clearance: 92.5 mL/min (by C-G formula based on SCr of 0.48 mg/dL). Liver Function Tests: Recent Labs  Lab 09/27/17 0602  AST 46*  ALT 52  ALKPHOS 141*  BILITOT 0.6  PROT 6.7  ALBUMIN 2.8*   No results for input(s): LIPASE, AMYLASE in the last 168 hours. No results for input(s): AMMONIA in the last 168 hours. Coagulation Profile: No results for input(s): INR, PROTIME in the last 168 hours. Cardiac Enzymes: No results for input(s): CKTOTAL, CKMB, CKMBINDEX, TROPONINI in the last 168 hours. BNP (last 3 results) No results for input(s): PROBNP in the last 8760 hours. HbA1C: No results for input(s): HGBA1C in the last 72 hours. CBG: No results for input(s): GLUCAP in the last 168 hours. Lipid Profile: No results for input(s): CHOL, HDL, LDLCALC, TRIG, CHOLHDL, LDLDIRECT in the last 72 hours. Thyroid Function Tests: No results for input(s): TSH, T4TOTAL, FREET4, T3FREE, THYROIDAB in the last 72 hours. Anemia Panel: No results for input(s): VITAMINB12, FOLATE, FERRITIN, TIBC, IRON, RETICCTPCT in the last 72 hours. Sepsis Labs: Recent Labs  Lab 09/26/17 1204 09/26/17 1511  LATICACIDVEN 2.39* 0.63    No results found for this or any previous visit (from the past 240 hour(s)).       Radiology Studies: Ct Soft Tissue Neck W Contrast  Result Date: 09/26/2017 CLINICAL DATA:  Facial edema involving the right periorbital  soft tissues. EXAM: CT MAXILLOFACIAL WITH CONTRAST; CT NECK WITH CONTRAST TECHNIQUE: Multidetector CT imaging of the neck and maxillofacial structures was performed with intravenous contrast. Multiplanar CT image reconstructions were also generated. CONTRAST:  75mL ISOVUE-300 IOPAMIDOL (ISOVUE-300) INJECTION 61% COMPARISON:  None. FINDINGS: CT MAXILLOFACIAL FINDINGS Osseous:  No fracture or other osseous abnormality. Orbits: There is moderate right periorbital soft tissue swelling without involvement of the postseptal structures. Optic nerve and extraocular muscles are normal. The globes are intact. Normal lacrimal apparatus. Left orbit is normal. Sinuses: Minimal bilateral maxillary mucosal thickening. No fluid levels. Mastoid air cells are clear. No middle ear effusion. Asymmetric aeration of the petrous apices, Marchelle Rinella normal variant. Soft tissues: Large amounts of lateral right facial and periorbital soft tissue swelling without abscess or drainable fluid collection. Inflammatory change extends to the subcutaneous fat but no involvement of the deep structures of the face. Limited  intracranial: Normal. CT NECK FINDINGS Pharynx and larynx: --Nasopharynx: Fossae of Rosenmuller are clear. Normal adenoid tonsils for age. --Oral cavity and oropharynx: The palatine and lingual tonsils are normal. The visible oral cavity and floor of mouth are normal. --Hypopharynx: Normal vallecula and pyriform sinuses. --Larynx: Normal epiglottis and pre-epiglottic space. Normal aryepiglottic and vocal folds. --Retropharyngeal space: No abscess, effusion or lymphadenopathy. Salivary glands: --Parotid: No mass lesion or inflammation. No sialolithiasis or ductal dilatation. --Submandibular: Symmetric without inflammation. No sialolithiasis or ductal dilatation. --Sublingual: Normal. No ranula or other visible lesion of the base of tongue and floor of mouth. Thyroid: Normal. Lymph nodes: There are bilateral enlarged cervical lymph nodes. Ladale Sherburn  right level IIa node measures 12 mm. Emer Onnen left level IIa node measures 10 mm. There are numerous bilateral level 5A lymph nodes that measure up to 9 mm. No abnormal density nodes. Vascular: Major cervical vessels are patent. Skeleton: No bony spinal canal stenosis. No lytic or blastic lesions. Upper chest: Ground-glass opacities in the left upper lobe. Visualization limited by photon starvation artifact due to body habitus. Other: None. IMPRESSION: 1. Moderate right periorbital and lateral facial soft tissue swelling extending to the subcutaneous fat without involvement of the deep facial structures, consistent with cellulitis. No abscess or fluid collection. No intraorbital extension. 2. Bilateral reactive level 2A and 5A cervical lymphadenopathy. 3. Ground-glass opacities in the anterior left upper lobe are poorly visualized, but could represent developing infection. Correlate with dedicated chest radiograph. Electronically Signed   By: Deatra RobinsonKevin  Herman M.D.   On: 09/26/2017 14:06   Ct Maxillofacial W Contrast  Result Date: 09/26/2017 CLINICAL DATA:  Facial edema involving the right periorbital soft tissues. EXAM: CT MAXILLOFACIAL WITH CONTRAST; CT NECK WITH CONTRAST TECHNIQUE: Multidetector CT imaging of the neck and maxillofacial structures was performed with intravenous contrast. Multiplanar CT image reconstructions were also generated. CONTRAST:  75mL ISOVUE-300 IOPAMIDOL (ISOVUE-300) INJECTION 61% COMPARISON:  None. FINDINGS: CT MAXILLOFACIAL FINDINGS Osseous:  No fracture or other osseous abnormality. Orbits: There is moderate right periorbital soft tissue swelling without involvement of the postseptal structures. Optic nerve and extraocular muscles are normal. The globes are intact. Normal lacrimal apparatus. Left orbit is normal. Sinuses: Minimal bilateral maxillary mucosal thickening. No fluid levels. Mastoid air cells are clear. No middle ear effusion. Asymmetric aeration of the petrous apices, Kathy Wares normal  variant. Soft tissues: Large amounts of lateral right facial and periorbital soft tissue swelling without abscess or drainable fluid collection. Inflammatory change extends to the subcutaneous fat but no involvement of the deep structures of the face. Limited intracranial: Normal. CT NECK FINDINGS Pharynx and larynx: --Nasopharynx: Fossae of Rosenmuller are clear. Normal adenoid tonsils for age. --Oral cavity and oropharynx: The palatine and lingual tonsils are normal. The visible oral cavity and floor of mouth are normal. --Hypopharynx: Normal vallecula and pyriform sinuses. --Larynx: Normal epiglottis and pre-epiglottic space. Normal aryepiglottic and vocal folds. --Retropharyngeal space: No abscess, effusion or lymphadenopathy. Salivary glands: --Parotid: No mass lesion or inflammation. No sialolithiasis or ductal dilatation. --Submandibular: Symmetric without inflammation. No sialolithiasis or ductal dilatation. --Sublingual: Normal. No ranula or other visible lesion of the base of tongue and floor of mouth. Thyroid: Normal. Lymph nodes: There are bilateral enlarged cervical lymph nodes. Lia Vigilante right level IIa node measures 12 mm. Kimley Apsey left level IIa node measures 10 mm. There are numerous bilateral level 5A lymph nodes that measure up to 9 mm. No abnormal density nodes. Vascular: Major cervical vessels are patent. Skeleton: No bony spinal canal stenosis. No  lytic or blastic lesions. Upper chest: Ground-glass opacities in the left upper lobe. Visualization limited by photon starvation artifact due to body habitus. Other: None. IMPRESSION: 1. Moderate right periorbital and lateral facial soft tissue swelling extending to the subcutaneous fat without involvement of the deep facial structures, consistent with cellulitis. No abscess or fluid collection. No intraorbital extension. 2. Bilateral reactive level 2A and 5A cervical lymphadenopathy. 3. Ground-glass opacities in the anterior left upper lobe are poorly visualized,  but could represent developing infection. Correlate with dedicated chest radiograph. Electronically Signed   By: Deatra Robinson M.D.   On: 09/26/2017 14:06        Scheduled Meds: . enoxaparin (LOVENOX) injection  40 mg Subcutaneous Q24H   Continuous Infusions: . sodium chloride 100 mL/hr (09/26/17 1704)  . cefTRIAXone (ROCEPHIN)  IV Stopped (09/26/17 1804)  . vancomycin       LOS: 1 day    Time spent: over 30 min    Lacretia Nicks, MD [facility] Pager [phone]   If 7PM-7AM, please contact night-coverage [email or web address] Password [email or web address] 09/27/2017, 11:04 AM

## 2017-09-27 NOTE — Progress Notes (Signed)
Telephone call from female stating she is patient's mother and she wanted us to know "she does all kinds of drugs, Heroin and anything they can get their hands on".  Lina SarBeth Shanitha Twining, RN

## 2017-09-28 DIAGNOSIS — L03213 Periorbital cellulitis: Secondary | ICD-10-CM

## 2017-09-28 LAB — RAPID URINE DRUG SCREEN, HOSP PERFORMED
AMPHETAMINES: NOT DETECTED
BARBITURATES: NOT DETECTED
Benzodiazepines: NOT DETECTED
Cocaine: NOT DETECTED
Opiates: POSITIVE — AB
TETRAHYDROCANNABINOL: NOT DETECTED

## 2017-09-28 LAB — COMPREHENSIVE METABOLIC PANEL
ALT: 42 U/L (ref 14–54)
AST: 32 U/L (ref 15–41)
Albumin: 2.9 g/dL — ABNORMAL LOW (ref 3.5–5.0)
Alkaline Phosphatase: 120 U/L (ref 38–126)
Anion gap: 8 (ref 5–15)
BUN: 8 mg/dL (ref 6–20)
CHLORIDE: 109 mmol/L (ref 101–111)
CO2: 23 mmol/L (ref 22–32)
CREATININE: 0.57 mg/dL (ref 0.44–1.00)
Calcium: 8.3 mg/dL — ABNORMAL LOW (ref 8.9–10.3)
GFR calc Af Amer: >60 mL/min (ref >60)
Glucose, Bld: 96 mg/dL (ref 65–99)
Potassium: 3.5 mmol/L (ref 3.5–5.1)
SODIUM: 140 mmol/L (ref 135–145)
Total Bilirubin: 0.2 mg/dL — ABNORMAL LOW (ref 0.3–1.2)
Total Protein: 7 g/dL (ref 6.5–8.1)

## 2017-09-28 LAB — URINALYSIS, ROUTINE W REFLEX MICROSCOPIC
BACTERIA UA: NONE SEEN
Bilirubin Urine: NEGATIVE
Glucose, UA: NEGATIVE mg/dL
Ketones, ur: 20 mg/dL — AB
Nitrite: NEGATIVE
PROTEIN: NEGATIVE mg/dL
SPECIFIC GRAVITY, URINE: 1.015 (ref 1.005–1.030)
pH: 7 (ref 5.0–8.0)

## 2017-09-28 LAB — CBC
HEMATOCRIT: 38.4 % (ref 36.0–46.0)
HEMOGLOBIN: 12.7 g/dL (ref 12.0–15.0)
MCH: 30.1 pg (ref 26.0–34.0)
MCHC: 33.1 g/dL (ref 30.0–36.0)
MCV: 91 fL (ref 78.0–100.0)
PLATELETS: 296 10*3/uL (ref 150–400)
RBC: 4.22 MIL/uL (ref 3.87–5.11)
RDW: 13.6 % (ref 11.5–15.5)
WBC: 9.2 10*3/uL (ref 4.0–10.5)

## 2017-09-28 LAB — MAGNESIUM: Magnesium: 2.1 mg/dL (ref 1.7–2.4)

## 2017-09-28 LAB — HIV ANTIBODY (ROUTINE TESTING W REFLEX): HIV SCREEN 4TH GENERATION: NONREACTIVE

## 2017-09-28 NOTE — Progress Notes (Signed)
PROGRESS NOTE  Mia [name] BJY:[id number] DOB: 04/03/1976 DOA: 09/26/2017 PCP: Patient, No Pcp Per  HPI/Recap of past 24 hours:  Feeling better, no fever  Assessment/Plan: Principal Problem:   Periorbital cellulitis of right eye Active Problems:   Opioid use disorder, moderate, dependence (HCC)   Bipolar 1 disorder, mixed, moderate (HCC)   Periorbital cellulitis   Right periorbital cellulitis with sepsis present on presentation -WBC 13.8, lactic acid 2.39 on presentation,  -CT on presentation: "Moderate right periorbital and lateral facial soft tissue swelling extending to the subcutaneous fat without involvement of the deep facial structures, consistent with cellulitis. No abscess or fluid collection. No intraorbital extension. 2. Bilateral reactive level 2A and 5A cervical lymphadenopathy. -Improving on Vanco and Rocephin, WBC normalized. Erythemal has improved, though lesion now appear fluctuant , consider reimaging to r/o forming abscess.   HTN: no prior history of such. Monitor Need pmd follow up.   History of polysubstance abuse including opiates and cocaine Urine tox screen this time negative for cocaine Positive for opiates but she received morphine in the hospital  Cigarette smoker, smoking cessation education, codeine patch provided   History of bipolar disorder Has not been on any meds for the last few month She reported recently relocated back to [place] from [place], no primary care physician  Code Status: full  Family Communication: patient   Disposition Plan: home once cellulitis continue to improve   Consultants:  none  Procedures:  none  Antibiotics:  vanc/rocephin   Objective: BP (!) 151/93 (BP Location: Left Arm)   Pulse 73   Temp 98.9 F (37.2 C) (Oral)   Resp 18   Ht 5\' 4"  (1.626 m)   Wt 74.8 kg (165 lb)   LMP 09/20/2017 Comment: Neg preg test 09/26/17  SpO2 100%   BMI 28.32 kg/m   Intake/Output Summary (Last 24  hours) at 09/28/2017 0915 Last data filed at 09/28/2017 0600 Gross per 24 hour  Intake 3255 ml  Output -  Net 3255 ml   Filed Weights   09/26/17 1108  Weight: 74.8 kg (165 lb)    Exam: Patient is examined daily including today on 09/28/2017, exams remain the same as of yesterday except that has changed    General:  NAD, right facial cellulitis appear improving. Right eyelid edema has resolved, but right facial cellulitis now seems more fluctuant   Cardiovascular: RRR  Respiratory: CTABL  Abdomen: Soft/ND/NT, positive BS  Musculoskeletal: No Edema  Neuro: alert, oriented   Data Reviewed: Basic Metabolic Panel: Recent Labs  Lab 09/26/17 1125 09/27/17 0602 09/28/17 0542  NA 135 136 140  K 5.0 3.4* 3.5  CL 102 105 109  CO2 23 24 23   GLUCOSE 117* 115* 96  BUN 5* 6 8  CREATININE 0.71 0.48 0.57  CALCIUM 8.9 8.0* 8.3*  MG  --   --  2.1   Liver Function Tests: Recent Labs  Lab 09/27/17 0602 09/28/17 0542  AST 46* 32  ALT 52 42  ALKPHOS 141* 120  BILITOT 0.6 0.2*  PROT 6.7 7.0  ALBUMIN 2.8* 2.9*   No results for input(s): LIPASE, AMYLASE in the last 168 hours. No results for input(s): AMMONIA in the last 168 hours. CBC: Recent Labs  Lab 09/26/17 1125 09/27/17 0602 09/28/17 0542  WBC 13.8* 11.5* 9.2  NEUTROABS 10.9*  --   --   HGB 14.7 12.6 12.7  HCT 42.5 37.8 38.4  MCV 91.0 91.5 91.0  PLT 328 260 296   Cardiac  Enzymes:   No results for input(s): CKTOTAL, CKMB, CKMBINDEX, TROPONINI in the last 168 hours. BNP (last 3 results) No results for input(s): BNP in the last 8760 hours.  ProBNP (last 3 results) No results for input(s): PROBNP in the last 8760 hours.  CBG: No results for input(s): GLUCAP in the last 168 hours.  Recent Results (from the past 240 hour(s))  Culture, blood (routine x 2)     Status: None (Preliminary result)   Collection Time: 09/26/17  1:01 PM  Result Value Ref Range Status   Specimen Description BLOOD LEFT ANTECUBITAL   Final   Special Requests   Final    BOTTLES DRAWN AEROBIC AND ANAEROBIC Blood Culture adequate volume   Culture   Final    NO GROWTH < 24 HOURS Performed at [facility], [address] [place], [place] [address]    Report Status PENDING  Incomplete  Culture, blood (routine x 2)     Status: None (Preliminary result)   Collection Time: 09/26/17  1:01 PM  Result Value Ref Range Status   Specimen Description BLOOD LEFT HAND  Final   Special Requests   Final    BOTTLES DRAWN AEROBIC AND ANAEROBIC Blood Culture adequate volume   Culture   Final    NO GROWTH < 24 HOURS Performed at [facility], [address] [place], [place] [address]    Report Status PENDING  Incomplete     Studies: No results found.  Scheduled Meds: . enoxaparin (LOVENOX) injection  40 mg Subcutaneous Q24H    Continuous Infusions: . sodium chloride 100 mL/hr at 09/28/17 0437  . cefTRIAXone (ROCEPHIN)  IV Stopped (09/27/17 1730)  . vancomycin Stopped (09/28/17 0150)     Time spent: 25mins I have personally reviewed and interpreted on  09/28/2017 daily labs,  imagings as discussed above under date review session and assessment and plans.  I reviewed all nursing notes, pharmacy notes,  vitals, pertinent old records  I have discussed plan of care as described above with RN , patient on 09/28/2017   Albertine GratesFang Macenzie Burford MD, PhD  [facility] Pager [phone]. If 7PM-7AM, please contact night-coverage at [email or web address], password [email or web address] 09/28/2017, 9:15 AM  LOS: 2 days

## 2017-09-29 ENCOUNTER — Inpatient Hospital Stay (HOSPITAL_COMMUNITY): Payer: Medicaid Other

## 2017-09-29 LAB — VANCOMYCIN, PEAK
Vancomycin Pk: 39 ug/mL (ref 30–40)
Vancomycin Pk: 23 ug/mL — ABNORMAL LOW (ref 30–40)

## 2017-09-29 LAB — BASIC METABOLIC PANEL
ANION GAP: 9 (ref 5–15)
BUN: 12 mg/dL (ref 6–20)
CALCIUM: 8.7 mg/dL — AB (ref 8.9–10.3)
CHLORIDE: 106 mmol/L (ref 101–111)
CO2: 24 mmol/L (ref 22–32)
CREATININE: 0.58 mg/dL (ref 0.44–1.00)
GFR calc non Af Amer: >60 mL/min (ref >60)
GLUCOSE: 107 mg/dL — AB (ref 65–99)
Potassium: 3.3 mmol/L — ABNORMAL LOW (ref 3.5–5.1)
Sodium: 139 mmol/L (ref 135–145)

## 2017-09-29 MED ORDER — SENNOSIDES-DOCUSATE SODIUM 8.6-50 MG PO TABS
1.0000 | ORAL_TABLET | Freq: Two times a day (BID) | ORAL | Status: DC
  Administered 2017-09-29 – 2017-09-30 (×3): 1 via ORAL
  Filled 2017-09-29 (×3): qty 1

## 2017-09-29 MED ORDER — OXYCODONE-ACETAMINOPHEN 5-325 MG PO TABS
1.0000 | ORAL_TABLET | Freq: Four times a day (QID) | ORAL | Status: DC | PRN
  Administered 2017-09-29 – 2017-09-30 (×3): 1 via ORAL
  Filled 2017-09-29 (×3): qty 1

## 2017-09-29 MED ORDER — POTASSIUM CHLORIDE CRYS ER 20 MEQ PO TBCR
40.0000 meq | EXTENDED_RELEASE_TABLET | Freq: Once | ORAL | Status: AC
  Administered 2017-09-29: 40 meq via ORAL
  Filled 2017-09-29: qty 2

## 2017-09-29 MED ORDER — NICOTINE 21 MG/24HR TD PT24
21.0000 mg | MEDICATED_PATCH | Freq: Every day | TRANSDERMAL | Status: DC
  Administered 2017-09-29 – 2017-09-30 (×2): 21 mg via TRANSDERMAL
  Filled 2017-09-29 (×2): qty 1

## 2017-09-29 NOTE — Progress Notes (Addendum)
PROGRESS NOTE  Mia [name] WUJ:[id number] DOB: 02/02/1976 DOA: 09/26/2017 PCP: Patient, No Pcp Per  HPI/Recap of past 24 hours:  Feeling better, no fever  Assessment/Plan: Principal Problem:   Periorbital cellulitis of right eye Active Problems:   Opioid use disorder, moderate, dependence (HCC)   Bipolar 1 disorder, mixed, moderate (HCC)   Periorbital cellulitis   Right periorbital cellulitis with sepsis present on presentation -WBC 13.8, lactic acid 2.39 on presentation,  -CT on presentation: "Moderate right periorbital and lateral facial soft tissue swelling extending to the subcutaneous fat without involvement of the deep facial structures, consistent with cellulitis. No abscess or fluid collection. No intraorbital extension. 2. Bilateral reactive level 2A and 5A cervical lymphadenopathy. -Improving on Vanco and Rocephin, WBC normalized. Erythemal has improved, though lesion now appear fluctuant , consider developing  Abscess, will get soft tissue head us.  Hypokalemia: replace k, check mag.  HTN: no prior history of such. Monitor Need pmd follow up.   History of polysubstance abuse including opiates and cocaine Urine tox screen this time negative for cocaine Positive for opiates but she received morphine in the hospital  Cigarette smoker, smoking cessation education, nicotine patch provided   History of bipolar disorder Has not been on any meds for the last few month She reported recently relocated back to [place] from [place], no primary care physician  Code Status: full  Family Communication: patient   Disposition Plan: home with oral abx once cellulitis continue to improve   Consultants:  none  Procedures:  none  Antibiotics:  Vanc/rocephin since admission   Objective: BP (!) 153/91 (BP Location: Left Arm) Comment: rechecked  Pulse 75   Temp 99.1 F (37.3 C)   Resp 18   Ht 5\' 4"  (1.626 m)   Wt 74.8 kg (165 lb)   LMP 09/20/2017  Comment: Neg preg test 09/26/17  SpO2 100%   BMI 28.32 kg/m   Intake/Output Summary (Last 24 hours) at 09/29/2017 1121 Last data filed at 09/29/2017 0948 Gross per 24 hour  Intake 790 ml  Output -  Net 790 ml   Filed Weights   09/26/17 1108  Weight: 74.8 kg (165 lb)    Exam: Patient is examined daily including today on 09/29/2017, exams remain the same as of yesterday except that has changed        General:  NAD, right facial cellulitis appear improving. Right eyelid edema has resolved, but right facial cellulitis now seems more fluctuant   Cardiovascular: RRR  Respiratory: CTABL  Abdomen: Soft/ND/NT, positive BS  Musculoskeletal: No Edema  Neuro: alert, oriented   Data Reviewed: Basic Metabolic Panel: Recent Labs  Lab 09/26/17 1125 09/27/17 0602 09/28/17 0542 09/29/17 0510  NA 135 136 140 139  K 5.0 3.4* 3.5 3.3*  CL 102 105 109 106  CO2 23 24 23 24   GLUCOSE 117* 115* 96 107*  BUN 5* 6 8 12   CREATININE 0.71 0.48 0.57 0.58  CALCIUM 8.9 8.0* 8.3* 8.7*  MG  --   --  2.1  --    Liver Function Tests: Recent Labs  Lab 09/27/17 0602 09/28/17 0542  AST 46* 32  ALT 52 42  ALKPHOS 141* 120  BILITOT 0.6 0.2*  PROT 6.7 7.0  ALBUMIN 2.8* 2.9*   No results for input(s): LIPASE, AMYLASE in the last 168 hours. No results for input(s): AMMONIA in the last 168 hours. CBC: Recent Labs  Lab 09/26/17 1125 09/27/17 0602 09/28/17 0542  WBC 13.8* 11.5* 9.2  NEUTROABS 10.9*  --   --   HGB 14.7 12.6 12.7  HCT 42.5 37.8 38.4  MCV 91.0 91.5 91.0  PLT 328 260 296   Cardiac Enzymes:   No results for input(s): CKTOTAL, CKMB, CKMBINDEX, TROPONINI in the last 168 hours. BNP (last 3 results) No results for input(s): BNP in the last 8760 hours.  ProBNP (last 3 results) No results for input(s): PROBNP in the last 8760 hours.  CBG: No results for input(s): GLUCAP in the last 168 hours.  Recent Results (from the past 240 hour(s))  Culture, blood (routine x 2)      Status: None (Preliminary result)   Collection Time: 09/26/17  1:01 PM  Result Value Ref Range Status   Specimen Description BLOOD LEFT ANTECUBITAL  Final   Special Requests   Final    BOTTLES DRAWN AEROBIC AND ANAEROBIC Blood Culture adequate volume   Culture   Final    NO GROWTH 2 DAYS Performed at [facility], [address] [place], [place] [address]    Report Status PENDING  Incomplete  Culture, blood (routine x 2)     Status: None (Preliminary result)   Collection Time: 09/26/17  1:01 PM  Result Value Ref Range Status   Specimen Description BLOOD LEFT HAND  Final   Special Requests   Final    BOTTLES DRAWN AEROBIC AND ANAEROBIC Blood Culture adequate volume   Culture   Final    NO GROWTH 2 DAYS Performed at [facility], [address] [place], [place] [address]    Report Status PENDING  Incomplete     Studies: No results found.  Scheduled Meds: . enoxaparin (LOVENOX) injection  40 mg Subcutaneous Q24H  . senna-docusate  1 tablet Oral BID    Continuous Infusions: . cefTRIAXone (ROCEPHIN)  IV Stopped (09/28/17 1955)  . vancomycin Stopped (09/29/17 0143)     Time spent: 25mins I have personally reviewed and interpreted on  09/29/2017 daily labs,  imagings as discussed above under date review session and assessment and plans.  I reviewed all nursing notes, pharmacy notes,  vitals, pertinent old records  I have discussed plan of care as described above with RN , patient on 09/29/2017   Albertine GratesFang Jubal Rademaker MD, PhD  [facility] Pager [phone]. If 7PM-7AM, please contact night-coverage at [email or web address], password [email or web address] 09/29/2017, 11:21 AM  LOS: 3 days

## 2017-09-30 LAB — BASIC METABOLIC PANEL
ANION GAP: 8 (ref 5–15)
BUN: 15 mg/dL (ref 6–20)
CHLORIDE: 108 mmol/L (ref 101–111)
CO2: 23 mmol/L (ref 22–32)
CREATININE: 0.59 mg/dL (ref 0.44–1.00)
Calcium: 8.7 mg/dL — ABNORMAL LOW (ref 8.9–10.3)
GFR calc non Af Amer: >60 mL/min (ref >60)
Glucose, Bld: 122 mg/dL — ABNORMAL HIGH (ref 65–99)
POTASSIUM: 3.3 mmol/L — AB (ref 3.5–5.1)
SODIUM: 139 mmol/L (ref 135–145)

## 2017-09-30 LAB — MRSA PCR SCREENING: MRSA by PCR: NEGATIVE

## 2017-09-30 LAB — VANCOMYCIN, TROUGH: VANCOMYCIN TR: 5 ug/mL — AB (ref 15–20)

## 2017-09-30 LAB — MAGNESIUM: MAGNESIUM: 2 mg/dL (ref 1.7–2.4)

## 2017-09-30 MED ORDER — LIDOCAINE HCL 2 % EX GEL
1.0000 "application " | Freq: Once | CUTANEOUS | Status: DC | PRN
  Filled 2017-09-30 (×2): qty 5

## 2017-09-30 MED ORDER — VANCOMYCIN HCL IN DEXTROSE 750-5 MG/150ML-% IV SOLN
750.0000 mg | Freq: Three times a day (TID) | INTRAVENOUS | Status: DC
  Administered 2017-09-30: 750 mg via INTRAVENOUS
  Filled 2017-09-30 (×2): qty 150

## 2017-09-30 MED ORDER — LIDOCAINE HCL 4 % EX SOLN
0.0000 mL | Freq: Once | CUTANEOUS | Status: DC | PRN
  Filled 2017-09-30: qty 50

## 2017-09-30 MED ORDER — POTASSIUM CHLORIDE CRYS ER 20 MEQ PO TBCR
40.0000 meq | EXTENDED_RELEASE_TABLET | Freq: Once | ORAL | Status: AC
  Administered 2017-09-30: 40 meq via ORAL
  Filled 2017-09-30: qty 2

## 2017-09-30 MED ORDER — OXYMETAZOLINE HCL 0.05 % NA SOLN: 1.0000 | Freq: Once | NASAL | Status: DC | PRN

## 2017-09-30 MED ORDER — LIDOCAINE-EPINEPHRINE (PF) 1 %-1:200000 IJ SOLN
0.0000 mL | Freq: Once | INTRAMUSCULAR | Status: DC | PRN
  Filled 2017-09-30: qty 30

## 2017-09-30 MED ORDER — TRIPLE ANTIBIOTIC 3.5-400-5000 EX OINT
1.0000 "application " | TOPICAL_OINTMENT | Freq: Once | CUTANEOUS | Status: DC | PRN
  Filled 2017-09-30: qty 1

## 2017-09-30 MED ORDER — SULFAMETHOXAZOLE-TRIMETHOPRIM 800-160 MG PO TABS: 1.0000 | ORAL_TABLET | Freq: Two times a day (BID) | ORAL | 0 refills | Status: AC

## 2017-09-30 MED ORDER — SILVER NITRATE-POT NITRATE 75-25 % EX MISC
1.0000 | Freq: Once | CUTANEOUS | Status: DC | PRN
  Filled 2017-09-30: qty 1

## 2017-09-30 MED ORDER — SULFAMETHOXAZOLE-TRIMETHOPRIM 800-160 MG PO TABS: 1.0000 | ORAL_TABLET | Freq: Two times a day (BID) | ORAL | 0 refills | Status: DC

## 2017-09-30 NOTE — Progress Notes (Signed)
Discharge instructions reviewed with patient and mother. Questions answered and both deny further questions. Awaiting prescription for Bactrim. Upon returning to chart, it was noted that Dr Lazarus SalinesWolicki has entered orders. Patient notified. Awaiting supplies Dr Lazarus SalinesWolicki has ordered. Lina SarBeth Kyland No, RN

## 2017-09-30 NOTE — Progress Notes (Signed)
Pharmacy Antibiotic Note  Mia [name] is a 41 y.o. female admitted on 09/26/2017 with right periorbital cellulitis with sepsis.  Pharmacy was consulted for vancomycin dosing.  Also receiving ceftriaxone 1 gram IV q24h.  SCr remains WNL. Leukocytosis improving Remains afebrile. 1619 Vp=23 and 0130 Vt=5   Plan: 1. Increase vancomycin to 750 mg IV q8h for est AUC=484, goal AUC 400-500. 2. Continue ceftriaxone 1 gram IV q24h as ordered. 3. Follow SCr, clinical course.   Height: 5\' 4"  (162.6 cm) Weight: 165 lb (74.8 kg) IBW/kg (Calculated) : 54.7  Temp (24hrs), Avg:98.9 F (37.2 C), Min:98.7 F (37.1 C), Max:99.1 F (37.3 C)  Recent Labs  Lab 09/26/17 1125 09/26/17 1204 09/26/17 1511 09/27/17 0602 09/28/17 0542 09/29/17 0510 09/29/17 1456 09/29/17 1619 09/30/17 0121  WBC 13.8*  --   --  11.5* 9.2  --   --   --   --   CREATININE 0.71  --   --  0.48 0.57 0.58  --   --  0.59  LATICACIDVEN  --  2.39* 0.63  --   --   --   --   --   --   VANCOTROUGH  --   --   --   --   --   --   --   --  5*  VANCOPEAK  --   --   --   --   --   --  39 23*  --     Estimated Creatinine Clearance: 92.5 mL/min (by C-G formula based on SCr of 0.59 mg/dL).    No Known Allergies  Antimicrobials this admission: 12/10 Ceftriaxone >>  12/10 Vancomcyin >>   Dose adjustments this admission: 12/11 Increased vancomycin from 750 mg q24h to 750 mg q12h. 12/14 Increased vancomycin to 750 mg IV q8h based on Vp/Vt calculations.  Microbiology results: 12/10 BCx: collected  Thank you for allowing pharmacy to be a part of this patient's care.   Lorenza EvangelistGreen, Celes Dedic R 09/30/2017  2:27 AM

## 2017-09-30 NOTE — Progress Notes (Signed)
Patient stating "I don't want to stay here anymore. I am about to walk out". Dr Roda ShuttersXu paged. Dr Roda ShuttersXu here immediately and is speaking to patient. Lina SarBeth Quandre Polinski, RN

## 2017-09-30 NOTE — Progress Notes (Signed)
Patient states she is leaving AMA. Dr Roda ShuttersXu paged to make her aware. MD here immediately. Lina SarBeth Dezmond Downie, RN

## 2017-09-30 NOTE — Progress Notes (Signed)
Received a referral for assistance with meds and finding PCP. Request indicated patient was uninsured. Confirmed patient has active Medicaid, she also states she does not have a PCP. Offered to assist with PCP arrangement, unable to make appt as they are closed for lunch provided the patient with contact information for her to call for appt. Medicaid will cover meds needed at d/c. No further HH needs identified. [phone]

## 2017-09-30 NOTE — Discharge Summary (Addendum)
Discharge Summary  Mia Comptonabitha A Daus ZHY:[id number] DOB: 06/25/1976  PCP: Patient, No Pcp Per  Admit date: 09/26/2017 Discharge date: 09/30/2017  Time spent: >2530mins, more than 50% time spent on coordination of care.  Recommendations for Outpatient Follow-up:  1. F/u with PMD within a week  for hospital discharge follow up, repeat cbc/bmp at follow up. pmd to follow up on final wound culture result. Adjust abx as needed. 2. F/u with ENT DR Lazarus SalinesWolicki  Early next week for facial cellulitis    Discharge Diagnoses:  Active Hospital Problems   Diagnosis Date Noted  . Periorbital cellulitis of right eye 09/26/2017  . Periorbital cellulitis 09/26/2017  . Bipolar 1 disorder, mixed, moderate (HCC)   . Opioid use disorder, moderate, dependence (HCC) 02/28/2016    Resolved Hospital Problems  No resolved problems to display.    Discharge Condition: stable  Diet recommendation: regular diet  Filed Weights   09/26/17 1108  Weight: 74.8 kg (165 lb)    History of present illness:  PCP: Patient, No Pcp Per  Patient coming from: Home  Chief Complaint: Pain, swelling of R eye  HPI: Mia [name] is a 41 y.o. female with medical history significant of anxiety, bipolar who presents with 2-3 days hx of increased swelling, pain, redness surrounding R eye. Symptoms began with a "pimple" which patient attempted to squeeze. Shortly thereafter, area became more tender and swollen which eventually resulted in near closure of R eye. Patient subsequently presented to ED   ED Course: In the ED, pt noted to have WBC of 13.8. Facial CT was notable for moderate R periorbital and lateral facial soft tissue swelling without abscess or fluid collection. Findings were worrisome for cellulitis. Patient was given vancomycin. Blood cultures orderd. Hospitalist consulted for consideration for admission.    Hospital Course:  Principal Problem:   Periorbital cellulitis of right eye Active Problems:   Opioid use disorder, moderate, dependence (HCC)   Bipolar 1 disorder, mixed, moderate (HCC)   Periorbital cellulitis   Right periorbital cellulitis with sepsis present on presentation -WBC 13.8, lactic acid 2.39 on presentation,  -CT on presentation: "Moderate right periorbital and lateral facial soft tissue swelling extending to the subcutaneous fat without involvement of the deep facial structures, consistent with cellulitis. No abscess or fluid collection. No intraorbital extension. 2. Bilateral reactive level 2A and 5A cervical lymphadenopathy. -Improving on Vanco and Rocephin, WBC normalized. Erythemal has much improved,  - lesion getting smaller but appear fluctuant  On 12/13, soft tissue head us done on 12/14 due to concerning developing  Abscess "Poorly defined area within this edematous tissue is slightly hypoechoic and measures 2.2 x 1.3 x 2.2 cm." -on 12/14 wound drained spontaneously, wound culture sent, she felt a lot better, trying to contact ENT Dr Lazarus SalinesWolicki to discuss case, patient want to leaving before I was able to talk to Dr Lazarus SalinesWolicki. Patient agree to continue oral abx and outpatient follow up with PMD and Dr Lazarus Salineswolicki. -she is discharged on oral bactrim for another 9 days to finish total of 14days abx treatment ( she received vanc and rocephin 5 days in the hospital)  5pm Addendum: Dr Lazarus SalinesWolicki returned call, case discussed with Dr Lazarus SalinesWolicki, chart reviewed, Dr Lazarus SalinesWolicki will see patient in his office early next week. Wound may need debridement.  Patient and mother aware and agrees to the plan.  Picture taken on 12/11:    Picture taken on 12/13:     Hypokalemia: replaced k,  Mag 2.0  HTN: no  prior history of such. Monitor Need pmd follow up.   History of polysubstance abuse including opiates and cocaine -Urine tox screen this time negative for cocaine -Positive for opiates but she received morphine in the hospital  Cigarette smoker, smoking cessation education,  nicotine patch provided   History of bipolar disorder Has not been on any meds for the last few month She reported recently relocated back to [facility] from [place], no primary care physician  Code Status: full  Family Communication: patient and mother at bedside  Disposition Plan: home with oral abx with close pmd and ENT follow up. Patient and mother understand the plan of care and agreed to it.   Consultants:  Requested consult from ENT Dr Lazarus Salines, patient wants to be discharged and  follow up with Dr Lazarus Salines outpatient.   Procedures:  none  Antibiotics:  Vanc/rocephin since admission   Discharge Exam: BP (!) 153/92 (BP Location: Right Arm)   Pulse 75   Temp 99.6 F (37.6 C) (Oral)   Resp 16   Ht 5\' 4"  (1.626 m)   Wt 74.8 kg (165 lb)   LMP 09/20/2017 Comment: Neg preg test 09/26/17  SpO2 100%   BMI 28.32 kg/m    General:  NAD, right facial cellulitis has much improved. Right eyelid edema has resolved.   Cardiovascular: RRR  Respiratory: CTABL  Abdomen: Soft/ND/NT, positive BS  Musculoskeletal: No Edema  Neuro: alert, oriented    Discharge Instructions You were cared for by a hospitalist during your hospital stay. If you have any questions about your discharge medications or the care you received while you were in the hospital after you are discharged, you can call the unit and asked to speak with the hospitalist on call if the hospitalist that took care of you is not available. Once you are discharged, your primary care physician will handle any further medical issues. Please note that NO REFILLS for any discharge medications will be authorized once you are discharged, as it is imperative that you return to your primary care physician (or establish a relationship with a primary care physician if you do not have one) for your aftercare needs so that they can reassess your need for medications and monitor your lab values.  Discharge Instructions     Diet general   Complete by:  As directed    Increase activity slowly   Complete by:  As directed      Allergies as of 09/30/2017   No Known Allergies     Medication List    STOP taking these medications   clonazePAM 1 MG tablet Commonly known as:  KLONOPIN   Guaifenesin 1200 MG Tb12   lamoTRIgine 200 MG tablet Commonly known as:  LAMICTAL   predniSONE 50 MG tablet Commonly known as:  DELTASONE   promethazine-dextromethorphan 6.25-15 MG/5ML syrup Commonly known as:  PROMETHAZINE-DM   traZODone 100 MG tablet Commonly known as:  DESYREL   ziprasidone 60 MG capsule Commonly known as:  GEODON     TAKE these medications   sulfamethoxazole-trimethoprim 800-160 MG tablet Commonly known as:  BACTRIM DS,SEPTRA DS Take 1 tablet by mouth 2 (two) times daily for 9 days. What changed:  when to take this      No Known Allergies Follow-up Information    [facility] Follow up on 10/05/2017.   Why:  hospital discharge follow up. repeat cbc/bmp.  pmd to follow up on final wound culture result. Adjust abx as needed.  Contact  information: [address] [place] [address] [phone]       Flo Shanks, MD Follow up in 1 week(s).   Specialty:  Otolaryngology Why:  for facial cellulitis  Contact information: [address] [place] [address] [phone]            The results of significant diagnostics from this hospitalization (including imaging, microbiology, ancillary and laboratory) are listed below for reference.    Significant Diagnostic Studies: Ct Soft Tissue Neck W Contrast  Result Date: 09/26/2017 CLINICAL DATA:  Facial edema involving the right periorbital soft tissues. EXAM: CT MAXILLOFACIAL WITH CONTRAST; CT NECK WITH CONTRAST TECHNIQUE: Multidetector CT imaging of the neck and maxillofacial structures was performed with intravenous contrast. Multiplanar CT image reconstructions  were also generated. CONTRAST:  75mL ISOVUE-300 IOPAMIDOL (ISOVUE-300) INJECTION 61% COMPARISON:  None. FINDINGS: CT MAXILLOFACIAL FINDINGS Osseous:  No fracture or other osseous abnormality. Orbits: There is moderate right periorbital soft tissue swelling without involvement of the postseptal structures. Optic nerve and extraocular muscles are normal. The globes are intact. Normal lacrimal apparatus. Left orbit is normal. Sinuses: Minimal bilateral maxillary mucosal thickening. No fluid levels. Mastoid air cells are clear. No middle ear effusion. Asymmetric aeration of the petrous apices, a normal variant. Soft tissues: Large amounts of lateral right facial and periorbital soft tissue swelling without abscess or drainable fluid collection. Inflammatory change extends to the subcutaneous fat but no involvement of the deep structures of the face. Limited intracranial: Normal. CT NECK FINDINGS Pharynx and larynx: --Nasopharynx: Fossae of Rosenmuller are clear. Normal adenoid tonsils for age. --Oral cavity and oropharynx: The palatine and lingual tonsils are normal. The visible oral cavity and floor of mouth are normal. --Hypopharynx: Normal vallecula and pyriform sinuses. --Larynx: Normal epiglottis and pre-epiglottic space. Normal aryepiglottic and vocal folds. --Retropharyngeal space: No abscess, effusion or lymphadenopathy. Salivary glands: --Parotid: No mass lesion or inflammation. No sialolithiasis or ductal dilatation. --Submandibular: Symmetric without inflammation. No sialolithiasis or ductal dilatation. --Sublingual: Normal. No ranula or other visible lesion of the base of tongue and floor of mouth. Thyroid: Normal. Lymph nodes: There are bilateral enlarged cervical lymph nodes. A right level IIa node measures 12 mm. A left level IIa node measures 10 mm. There are numerous bilateral level 5A lymph nodes that measure up to 9 mm. No abnormal density nodes. Vascular: Major cervical vessels are patent. Skeleton:  No bony spinal canal stenosis. No lytic or blastic lesions. Upper chest: Ground-glass opacities in the left upper lobe. Visualization limited by photon starvation artifact due to body habitus. Other: None. IMPRESSION: 1. Moderate right periorbital and lateral facial soft tissue swelling extending to the subcutaneous fat without involvement of the deep facial structures, consistent with cellulitis. No abscess or fluid collection. No intraorbital extension. 2. Bilateral reactive level 2A and 5A cervical lymphadenopathy. 3. Ground-glass opacities in the anterior left upper lobe are poorly visualized, but could represent developing infection. Correlate with dedicated chest radiograph. Electronically Signed   By: Deatra Robinson M.D.   On: 09/26/2017 14:06   US Soft Tissue Head & Neck (non-thyroid)  Result Date: 09/29/2017 CLINICAL DATA:  Right lateral face swelling for 1 week. EXAM: ULTRASOUND OF HEAD/NECK SOFT TISSUES TECHNIQUE: Ultrasound examination of the head and neck soft tissues was performed in the area of clinical concern. COMPARISON:  CT 09/26/2017 FINDINGS: Soft tissue along the right side of the face is edematous. Poorly defined area within this edematous tissue is slightly hypoechoic and measures 2.2 x 1.3  x 2.2 cm. IMPRESSION: Edematous tissue on the right side of the face. Poorly defined area within this edematous tissue is nonspecific. No discrete fluid or abscess collection at this time. Electronically Signed   By: Richarda OverlieAdam  Henn M.D.   On: 09/29/2017 15:31   Ct Maxillofacial W Contrast  Result Date: 09/26/2017 CLINICAL DATA:  Facial edema involving the right periorbital soft tissues. EXAM: CT MAXILLOFACIAL WITH CONTRAST; CT NECK WITH CONTRAST TECHNIQUE: Multidetector CT imaging of the neck and maxillofacial structures was performed with intravenous contrast. Multiplanar CT image reconstructions were also generated. CONTRAST:  75mL ISOVUE-300 IOPAMIDOL (ISOVUE-300) INJECTION 61% COMPARISON:  None.  FINDINGS: CT MAXILLOFACIAL FINDINGS Osseous:  No fracture or other osseous abnormality. Orbits: There is moderate right periorbital soft tissue swelling without involvement of the postseptal structures. Optic nerve and extraocular muscles are normal. The globes are intact. Normal lacrimal apparatus. Left orbit is normal. Sinuses: Minimal bilateral maxillary mucosal thickening. No fluid levels. Mastoid air cells are clear. No middle ear effusion. Asymmetric aeration of the petrous apices, a normal variant. Soft tissues: Large amounts of lateral right facial and periorbital soft tissue swelling without abscess or drainable fluid collection. Inflammatory change extends to the subcutaneous fat but no involvement of the deep structures of the face. Limited intracranial: Normal. CT NECK FINDINGS Pharynx and larynx: --Nasopharynx: Fossae of Rosenmuller are clear. Normal adenoid tonsils for age. --Oral cavity and oropharynx: The palatine and lingual tonsils are normal. The visible oral cavity and floor of mouth are normal. --Hypopharynx: Normal vallecula and pyriform sinuses. --Larynx: Normal epiglottis and pre-epiglottic space. Normal aryepiglottic and vocal folds. --Retropharyngeal space: No abscess, effusion or lymphadenopathy. Salivary glands: --Parotid: No mass lesion or inflammation. No sialolithiasis or ductal dilatation. --Submandibular: Symmetric without inflammation. No sialolithiasis or ductal dilatation. --Sublingual: Normal. No ranula or other visible lesion of the base of tongue and floor of mouth. Thyroid: Normal. Lymph nodes: There are bilateral enlarged cervical lymph nodes. A right level IIa node measures 12 mm. A left level IIa node measures 10 mm. There are numerous bilateral level 5A lymph nodes that measure up to 9 mm. No abnormal density nodes. Vascular: Major cervical vessels are patent. Skeleton: No bony spinal canal stenosis. No lytic or blastic lesions. Upper chest: Ground-glass opacities in the  left upper lobe. Visualization limited by photon starvation artifact due to body habitus. Other: None. IMPRESSION: 1. Moderate right periorbital and lateral facial soft tissue swelling extending to the subcutaneous fat without involvement of the deep facial structures, consistent with cellulitis. No abscess or fluid collection. No intraorbital extension. 2. Bilateral reactive level 2A and 5A cervical lymphadenopathy. 3. Ground-glass opacities in the anterior left upper lobe are poorly visualized, but could represent developing infection. Correlate with dedicated chest radiograph. Electronically Signed   By: Deatra RobinsonKevin  Herman M.D.   On: 09/26/2017 14:06    Microbiology: Recent Results (from the past 240 hour(s))  Culture, blood (routine x 2)     Status: None (Preliminary result)   Collection Time: 09/26/17  1:01 PM  Result Value Ref Range Status   Specimen Description BLOOD LEFT ANTECUBITAL  Final   Special Requests   Final    BOTTLES DRAWN AEROBIC AND ANAEROBIC Blood Culture adequate volume   Culture   Final    NO GROWTH 4 DAYS Performed at [facility], [address] [place], [place] [address]    Report Status PENDING  Incomplete  Culture, blood (routine x 2)     Status: None (Preliminary result)  Collection Time: 09/26/17  1:01 PM  Result Value Ref Range Status   Specimen Description BLOOD LEFT HAND  Final   Special Requests   Final    BOTTLES DRAWN AEROBIC AND ANAEROBIC Blood Culture adequate volume   Culture   Final    NO GROWTH 4 DAYS Performed at [facility], [address] [place], [place] [address]    Report Status PENDING  Incomplete  MRSA PCR Screening     Status: None   Collection Time: 09/30/17  1:55 AM  Result Value Ref Range Status   MRSA by PCR NEGATIVE NEGATIVE Final    Comment:        The GeneXpert MRSA Assay (FDA approved for NASAL specimens only), is one component of a comprehensive MRSA colonization surveillance program. It is not intended to  diagnose MRSA infection nor to guide or monitor treatment for MRSA infections.      Labs: Basic Metabolic Panel: Recent Labs  Lab 09/26/17 1125 09/27/17 0602 09/28/17 0542 09/29/17 0510 09/30/17 0121  NA 135 136 140 139 139  K 5.0 3.4* 3.5 3.3* 3.3*  CL 102 105 109 106 108  CO2 23 24 23 24 23   GLUCOSE 117* 115* 96 107* 122*  BUN 5* 6 8 12 15   CREATININE 0.71 0.48 0.57 0.58 0.59  CALCIUM 8.9 8.0* 8.3* 8.7* 8.7*  MG  --   --  2.1  --  2.0   Liver Function Tests: Recent Labs  Lab 09/27/17 0602 09/28/17 0542  AST 46* 32  ALT 52 42  ALKPHOS 141* 120  BILITOT 0.6 0.2*  PROT 6.7 7.0  ALBUMIN 2.8* 2.9*   No results for input(s): LIPASE, AMYLASE in the last 168 hours. No results for input(s): AMMONIA in the last 168 hours. CBC: Recent Labs  Lab 09/26/17 1125 09/27/17 0602 09/28/17 0542  WBC 13.8* 11.5* 9.2  NEUTROABS 10.9*  --   --   HGB 14.7 12.6 12.7  HCT 42.5 37.8 38.4  MCV 91.0 91.5 91.0  PLT 328 260 296   Cardiac Enzymes: No results for input(s): CKTOTAL, CKMB, CKMBINDEX, TROPONINI in the last 168 hours. BNP: BNP (last 3 results) No results for input(s): BNP in the last 8760 hours.  ProBNP (last 3 results) No results for input(s): PROBNP in the last 8760 hours.  CBG: No results for input(s): GLUCAP in the last 168 hours.     Signed:  Albertine Grates MD, PhD  [facility] 09/30/2017, 4:42 PM

## 2017-10-01 LAB — CULTURE, BLOOD (ROUTINE X 2)
CULTURE: NO GROWTH
CULTURE: NO GROWTH
SPECIAL REQUESTS: ADEQUATE
Special Requests: ADEQUATE

## 2017-10-05 LAB — AEROBIC/ANAEROBIC CULTURE (SURGICAL/DEEP WOUND): SPECIAL REQUESTS: NORMAL

## 2017-10-05 LAB — AEROBIC/ANAEROBIC CULTURE W GRAM STAIN (SURGICAL/DEEP WOUND)

## 2017-10-21 ENCOUNTER — Other Ambulatory Visit: Payer: Self-pay

## 2017-10-21 ENCOUNTER — Emergency Department (HOSPITAL_COMMUNITY)
Admission: EM | Admit: 2017-10-21 | Discharge: 2017-10-22 | Disposition: A | Discharge status: Other Acute Inpatient Hospital | Payer: Medicaid Other | Attending: Emergency Medicine | Admitting: Emergency Medicine

## 2017-10-21 ENCOUNTER — Encounter (HOSPITAL_COMMUNITY): Payer: Self-pay | Admitting: Emergency Medicine

## 2017-10-21 DIAGNOSIS — F17210 Nicotine dependence, cigarettes, uncomplicated: Secondary | ICD-10-CM | POA: Diagnosis not present

## 2017-10-21 DIAGNOSIS — R45851 Suicidal ideations: Secondary | ICD-10-CM | POA: Insufficient documentation

## 2017-10-21 DIAGNOSIS — F319 Bipolar disorder, unspecified: Secondary | ICD-10-CM | POA: Diagnosis not present

## 2017-10-21 DIAGNOSIS — T401X2A Poisoning by heroin, intentional self-harm, initial encounter: Secondary | ICD-10-CM | POA: Diagnosis present

## 2017-10-21 DIAGNOSIS — T50902A Poisoning by unspecified drugs, medicaments and biological substances, intentional self-harm, initial encounter: Secondary | ICD-10-CM

## 2017-10-21 DIAGNOSIS — F1910 Other psychoactive substance abuse, uncomplicated: Secondary | ICD-10-CM

## 2017-10-21 LAB — CBC WITH DIFFERENTIAL/PLATELET
Basophils Absolute: 0.1 10*3/uL (ref 0.0–0.1)
Basophils Relative: 1 %
EOS PCT: 4 %
Eosinophils Absolute: 0.3 10*3/uL (ref 0.0–0.7)
HEMATOCRIT: 38.4 % (ref 36.0–46.0)
HEMOGLOBIN: 13.2 g/dL (ref 12.0–15.0)
LYMPHS ABS: 3.2 10*3/uL (ref 0.7–4.0)
LYMPHS PCT: 46 %
MCH: 31.2 pg (ref 26.0–34.0)
MCHC: 34.4 g/dL (ref 30.0–36.0)
MCV: 90.8 fL (ref 78.0–100.0)
Monocytes Absolute: 0.3 10*3/uL (ref 0.1–1.0)
Monocytes Relative: 4 %
NEUTROS ABS: 3.2 10*3/uL (ref 1.7–7.7)
Neutrophils Relative %: 45 %
Platelets: 279 10*3/uL (ref 150–400)
RBC: 4.23 MIL/uL (ref 3.87–5.11)
RDW: 13.9 % (ref 11.5–15.5)
WBC: 7 10*3/uL (ref 4.0–10.5)

## 2017-10-21 LAB — RAPID URINE DRUG SCREEN, HOSP PERFORMED
AMPHETAMINES: NOT DETECTED
BARBITURATES: NOT DETECTED
Benzodiazepines: NOT DETECTED
Cocaine: NOT DETECTED
Opiates: POSITIVE — AB
TETRAHYDROCANNABINOL: NOT DETECTED

## 2017-10-21 LAB — COMPREHENSIVE METABOLIC PANEL
ALBUMIN: 3.6 g/dL (ref 3.5–5.0)
ALT: 38 U/L (ref 14–54)
AST: 27 U/L (ref 15–41)
Alkaline Phosphatase: 77 U/L (ref 38–126)
Anion gap: 8 (ref 5–15)
BUN: 10 mg/dL (ref 6–20)
CHLORIDE: 106 mmol/L (ref 101–111)
CO2: 24 mmol/L (ref 22–32)
Calcium: 8.6 mg/dL — ABNORMAL LOW (ref 8.9–10.3)
Creatinine, Ser: 0.73 mg/dL (ref 0.44–1.00)
GFR calc Af Amer: >60 mL/min (ref >60)
Glucose, Bld: 102 mg/dL — ABNORMAL HIGH (ref 65–99)
POTASSIUM: 4.3 mmol/L (ref 3.5–5.1)
Sodium: 138 mmol/L (ref 135–145)
Total Bilirubin: 0.4 mg/dL (ref 0.3–1.2)
Total Protein: 7.2 g/dL (ref 6.5–8.1)

## 2017-10-21 LAB — I-STAT BETA HCG BLOOD, ED (MC, WL, AP ONLY)

## 2017-10-21 NOTE — ED Provider Notes (Signed)
[facility]-EMERGENCY DEPT Provider Note   CSN: [id number] Arrival date & time: 10/21/17  2239     History   Chief Complaint Chief Complaint  Patient presents with  . Drug Overdose    HPI Mehek A Grace is a 42 y.o. female.  The history is provided by the patient. No language interpreter was used.  Drug Overdose    IVORIE UPLINGER is a 42 y.o. female who presents to the Emergency Department complaining of drug overdose.  She presents via EMS following drug overdose.  She was found in the bathroom by her mother with blue discoloration of her face.  For EMS her sats were in the 70s and Narcan was administered intranasally.  She reports that she snorted heroin and took about 15 Flexeril and tramadol( oral) prior to becoming unresponsive.  She states that she wants to stop abusing drugs and wants help for substance abuse.  She endorses passive suicidality because she does not want to live this way.  She does not have any suicidal plan.  She denies any chest pain, abdominal pain, nausea, vomiting. Past Medical History:  Diagnosis Date  . Anxiety   . Bipolar 1 disorder (HCC)   . Cystic acne     Patient Active Problem List   Diagnosis Date Noted  . Periorbital cellulitis of right eye 09/26/2017  . Periorbital cellulitis 09/26/2017  . Suicidal ideation 03/01/2016  . UTI (urinary tract infection) 03/01/2016  . Bipolar 1 disorder, mixed, moderate (HCC)   . Opioid use disorder, moderate, dependence (HCC) 02/28/2016  . 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 Substance induced mood disorder (HCC) 02/28/2016    Past Surgical History:  Procedure Laterality Date  . CESAREAN SECTION      OB History    No data available       Home Medications    Prior to Admission medications   Not on File    Family History No family history on file.  Social History Social History   Tobacco Use  . Smoking status:  Current Some Day Smoker    Packs/day: 1.00    Types: Cigarettes  . Smokeless tobacco: Never Used  Substance Use Topics  . Alcohol use: No  . Drug use: No     Allergies   Patient has no known allergies.   Review of Systems Review of Systems  All other systems reviewed and are negative.    Physical Exam Updated Vital Signs There were no vitals taken for this visit.  Physical Exam  Constitutional: She is oriented to person, place, and time. She appears well-developed and well-nourished.  HENT:  Head: Normocephalic and atraumatic.  Cardiovascular: Normal rate and regular rhythm.  No murmur heard. Pulmonary/Chest: Effort normal and breath sounds normal. No respiratory distress.  Abdominal: Soft. There is no tenderness. There is no rebound and no guarding.  Musculoskeletal: She exhibits no edema or tenderness.  Neurological: She is alert and oriented to person, place, and time.  Skin: Skin is warm and dry.  Psychiatric: Her behavior is normal.  Anxious appearing  Nursing note and vitals reviewed.    ED Treatments / Results  Labs (all labs ordered are listed, but only abnormal results are displayed) Labs Reviewed  CBC WITH DIFFERENTIAL/PLATELET  COMPREHENSIVE METABOLIC PANEL  ETHANOL  ACETAMINOPHEN LEVEL  SALICYLATE LEVEL  RAPID URINE DRUG SCREEN, HOSP PERFORMED  I-STAT BETA HCG BLOOD, ED ([facility], [facility], [facility] ONLY)    EKG  EKG  Interpretation None       Radiology No results found.  Procedures Procedures (including critical care time)  Medications Ordered in ED Medications - No data to display   Initial Impression / Assessment and Plan / ED Course  I have reviewed the triage vital signs and the nursing notes.  Pertinent labs & imaging results that were available during my care of the patient were reviewed by me and considered in my medical decision making (see chart for details).     Patient here for evaluation following overdose, she endorses suicidal  thoughts in the department.    On repeat assessment at 1225 she is somnolent but arousable to verbal stimuli.  She does endorse ongoing suicidal thoughts with no active plan.  Recommend continued observation for metabolization of drugs and reassessment at that time.  May consider TTS consult if she has ongoing suicidal thoughts.  Patient care transferred to Dr. Preston FleetingGlick pending repeat evaluation.  Final Clinical Impressions(s) / ED Diagnoses   Final diagnoses:  None    ED Discharge Orders    None       Tilden Fossaees, Rishith Siddoway, MD 10/22/17 [phone]

## 2017-10-21 NOTE — ED Triage Notes (Signed)
Pt arriving from home. Pt admitted to taking heroine, flexeril, tramadol. Pt stated she had taken approx $100 worth of drugs. Pt received 2mg  Narcan nasal. Pt had sats in 70's prior to Narcan. Now in the 90's. Pt alert and ambulatory at this time.

## 2017-10-21 NOTE — ED Notes (Signed)
Bed: RESA Expected date:  Expected time:  Means of arrival:  Comments: EMS Overdose 

## 2017-10-22 ENCOUNTER — Encounter (HOSPITAL_COMMUNITY): Payer: Self-pay

## 2017-10-22 ENCOUNTER — Other Ambulatory Visit: Payer: Self-pay

## 2017-10-22 ENCOUNTER — Inpatient Hospital Stay (HOSPITAL_COMMUNITY)
Admission: AD | Admit: 2017-10-22 | Discharge: 2017-10-25 | DRG: 885 | Disposition: A | Discharge status: Home / Self Care | Payer: Medicaid Other | Source: Intra-hospital | Attending: Emergency Medicine | Admitting: Emergency Medicine

## 2017-10-22 DIAGNOSIS — T401X2A Poisoning by heroin, intentional self-harm, initial encounter: Secondary | ICD-10-CM | POA: Diagnosis not present

## 2017-10-22 DIAGNOSIS — T481X2A Poisoning by skeletal muscle relaxants [neuromuscular blocking agents], intentional self-harm, initial encounter: Secondary | ICD-10-CM

## 2017-10-22 DIAGNOSIS — Z599 Problem related to housing and economic circumstances, unspecified: Secondary | ICD-10-CM | POA: Diagnosis not present

## 2017-10-22 DIAGNOSIS — Z91410 Personal history of adult physical and sexual abuse: Secondary | ICD-10-CM

## 2017-10-22 DIAGNOSIS — F332 Major depressive disorder, recurrent severe without psychotic features: Principal | ICD-10-CM | POA: Diagnosis present

## 2017-10-22 DIAGNOSIS — F1110 Opioid abuse, uncomplicated: Secondary | ICD-10-CM

## 2017-10-22 DIAGNOSIS — R45851 Suicidal ideations: Secondary | ICD-10-CM

## 2017-10-22 DIAGNOSIS — L03213 Periorbital cellulitis: Secondary | ICD-10-CM | POA: Diagnosis present

## 2017-10-22 DIAGNOSIS — Z560 Unemployment, unspecified: Secondary | ICD-10-CM | POA: Diagnosis not present

## 2017-10-22 DIAGNOSIS — F1994 Other psychoactive substance use, unspecified with psychoactive substance-induced mood disorder: Secondary | ICD-10-CM | POA: Diagnosis not present

## 2017-10-22 DIAGNOSIS — T404X2A Poisoning by other synthetic narcotics, intentional self-harm, initial encounter: Secondary | ICD-10-CM

## 2017-10-22 DIAGNOSIS — R011 Cardiac murmur, unspecified: Secondary | ICD-10-CM

## 2017-10-22 DIAGNOSIS — F4010 Social phobia, unspecified: Secondary | ICD-10-CM | POA: Diagnosis not present

## 2017-10-22 DIAGNOSIS — F39 Unspecified mood [affective] disorder: Secondary | ICD-10-CM | POA: Diagnosis not present

## 2017-10-22 DIAGNOSIS — B1920 Unspecified viral hepatitis C without hepatic coma: Secondary | ICD-10-CM | POA: Diagnosis present

## 2017-10-22 DIAGNOSIS — G4700 Insomnia, unspecified: Secondary | ICD-10-CM | POA: Diagnosis present

## 2017-10-22 DIAGNOSIS — F17210 Nicotine dependence, cigarettes, uncomplicated: Secondary | ICD-10-CM | POA: Diagnosis present

## 2017-10-22 DIAGNOSIS — F419 Anxiety disorder, unspecified: Secondary | ICD-10-CM | POA: Diagnosis present

## 2017-10-22 DIAGNOSIS — F1194 Opioid use, unspecified with opioid-induced mood disorder: Secondary | ICD-10-CM | POA: Diagnosis present

## 2017-10-22 DIAGNOSIS — R450 Nervousness: Secondary | ICD-10-CM | POA: Diagnosis not present

## 2017-10-22 DIAGNOSIS — R440 Auditory hallucinations: Secondary | ICD-10-CM

## 2017-10-22 DIAGNOSIS — T481X1A Poisoning by skeletal muscle relaxants [neuromuscular blocking agents], accidental (unintentional), initial encounter: Secondary | ICD-10-CM | POA: Diagnosis not present

## 2017-10-22 DIAGNOSIS — R51 Headache: Secondary | ICD-10-CM | POA: Diagnosis not present

## 2017-10-22 DIAGNOSIS — F319 Bipolar disorder, unspecified: Secondary | ICD-10-CM | POA: Diagnosis not present

## 2017-10-22 DIAGNOSIS — I440 Atrioventricular block, first degree: Secondary | ICD-10-CM

## 2017-10-22 HISTORY — DX: Major depressive disorder, recurrent severe without psychotic features: F33.2

## 2017-10-22 LAB — I-STAT CHEM 8, ED
BUN: 22 mg/dL — ABNORMAL HIGH (ref 6–20)
Calcium, Ion: 1.18 mmol/L (ref 1.15–1.40)
Chloride: 99 mmol/L — ABNORMAL LOW (ref 101–111)
Creatinine, Ser: 0.9 mg/dL (ref 0.44–1.00)
Glucose, Bld: 92 mg/dL (ref 65–99)
HEMATOCRIT: 38 % (ref 36.0–46.0)
HEMOGLOBIN: 12.9 g/dL (ref 12.0–15.0)
POTASSIUM: 4.2 mmol/L (ref 3.5–5.1)
SODIUM: 137 mmol/L (ref 135–145)
TCO2: 30 mmol/L (ref 22–32)

## 2017-10-22 LAB — ETHANOL: Alcohol, Ethyl (B): <10 mg/dL (ref <10)

## 2017-10-22 LAB — ACETAMINOPHEN LEVEL

## 2017-10-22 LAB — I-STAT TROPONIN, ED: TROPONIN I, POC: 0 ng/mL (ref 0.00–0.08)

## 2017-10-22 LAB — SALICYLATE LEVEL

## 2017-10-22 MED ORDER — NAPROXEN 500 MG PO TABS
500.0000 mg | ORAL_TABLET | Freq: Two times a day (BID) | ORAL | Status: DC | PRN
  Administered 2017-10-22 – 2017-10-24 (×4): 500 mg via ORAL
  Filled 2017-10-22 (×4): qty 1

## 2017-10-22 MED ORDER — CLONIDINE HCL 0.1 MG PO TABS
0.1000 mg | ORAL_TABLET | Freq: Four times a day (QID) | ORAL | Status: AC
  Administered 2017-10-22 – 2017-10-23 (×7): 0.1 mg via ORAL
  Filled 2017-10-22 (×11): qty 1

## 2017-10-22 MED ORDER — HYDROXYZINE HCL 25 MG PO TABS
25.0000 mg | ORAL_TABLET | Freq: Four times a day (QID) | ORAL | Status: DC | PRN
  Administered 2017-10-22 – 2017-10-24 (×5): 25 mg via ORAL
  Filled 2017-10-22 (×5): qty 1

## 2017-10-22 MED ORDER — LORAZEPAM 1 MG PO TABS
1.0000 mg | ORAL_TABLET | Freq: Once | ORAL | Status: AC
  Administered 2017-10-22: 1 mg via ORAL
  Filled 2017-10-22: qty 1

## 2017-10-22 MED ORDER — METHOCARBAMOL 500 MG PO TABS
500.0000 mg | ORAL_TABLET | Freq: Three times a day (TID) | ORAL | Status: DC | PRN
  Administered 2017-10-22 – 2017-10-24 (×4): 500 mg via ORAL
  Filled 2017-10-22 (×4): qty 1

## 2017-10-22 MED ORDER — MAGNESIUM HYDROXIDE 400 MG/5ML PO SUSP: 30.0000 mL | Freq: Every day | ORAL | Status: DC | PRN

## 2017-10-22 MED ORDER — LOPERAMIDE HCL 2 MG PO CAPS
2.0000 mg | ORAL_CAPSULE | ORAL | Status: DC | PRN
  Administered 2017-10-23: 4 mg via ORAL
  Administered 2017-10-24: 2 mg via ORAL
  Filled 2017-10-22: qty 1
  Filled 2017-10-22: qty 2

## 2017-10-22 MED ORDER — INFLUENZA VAC SPLIT QUAD 0.5 ML IM SUSY
0.5000 mL | PREFILLED_SYRINGE | INTRAMUSCULAR | Status: DC
  Filled 2017-10-22: qty 0.5

## 2017-10-22 MED ORDER — CLONIDINE HCL 0.1 MG PO TABS
0.1000 mg | ORAL_TABLET | Freq: Every day | ORAL | Status: DC
  Filled 2017-10-22: qty 1

## 2017-10-22 MED ORDER — ACETAMINOPHEN 325 MG PO TABS
650.0000 mg | ORAL_TABLET | Freq: Four times a day (QID) | ORAL | Status: DC | PRN
  Administered 2017-10-22 – 2017-10-24 (×2): 650 mg via ORAL
  Filled 2017-10-22 (×2): qty 2

## 2017-10-22 MED ORDER — CLONIDINE HCL 0.1 MG PO TABS
0.1000 mg | ORAL_TABLET | ORAL | Status: DC
  Administered 2017-10-24 – 2017-10-25 (×3): 0.1 mg via ORAL
  Filled 2017-10-22 (×5): qty 1

## 2017-10-22 MED ORDER — ONDANSETRON HCL 4 MG PO TABS: 4.0000 mg | ORAL_TABLET | Freq: Three times a day (TID) | ORAL | Status: DC | PRN

## 2017-10-22 MED ORDER — NICOTINE 21 MG/24HR TD PT24
21.0000 mg | MEDICATED_PATCH | Freq: Every day | TRANSDERMAL | Status: DC
  Administered 2017-10-23 – 2017-10-25 (×3): 21 mg via TRANSDERMAL
  Filled 2017-10-22 (×4): qty 1

## 2017-10-22 MED ORDER — NICOTINE 14 MG/24HR TD PT24: 14.0000 mg | MEDICATED_PATCH | Freq: Every day | TRANSDERMAL | Status: DC

## 2017-10-22 MED ORDER — ALUM & MAG HYDROXIDE-SIMETH 200-200-20 MG/5ML PO SUSP: 30.0000 mL | ORAL | Status: DC | PRN

## 2017-10-22 MED ORDER — ONDANSETRON 4 MG PO TBDP
4.0000 mg | ORAL_TABLET | Freq: Four times a day (QID) | ORAL | Status: DC | PRN
  Administered 2017-10-24: 4 mg via ORAL
  Filled 2017-10-22: qty 1

## 2017-10-22 MED ORDER — QUETIAPINE FUMARATE 25 MG PO TABS
25.0000 mg | ORAL_TABLET | Freq: Two times a day (BID) | ORAL | Status: DC
  Administered 2017-10-22 (×2): 25 mg via ORAL
  Filled 2017-10-22 (×5): qty 1

## 2017-10-22 MED ORDER — ALUM & MAG HYDROXIDE-SIMETH 200-200-20 MG/5ML PO SUSP: 30.0000 mL | Freq: Four times a day (QID) | ORAL | Status: DC | PRN

## 2017-10-22 MED ORDER — QUETIAPINE FUMARATE 25 MG PO TABS
25.0000 mg | ORAL_TABLET | Freq: Every day | ORAL | Status: DC
  Administered 2017-10-23 – 2017-10-25 (×3): 25 mg via ORAL
  Filled 2017-10-22 (×4): qty 1

## 2017-10-22 MED ORDER — QUETIAPINE FUMARATE 100 MG PO TABS
100.0000 mg | ORAL_TABLET | Freq: Every day | ORAL | Status: DC
  Administered 2017-10-22 – 2017-10-24 (×3): 100 mg via ORAL
  Filled 2017-10-22 (×5): qty 1

## 2017-10-22 MED ORDER — DICYCLOMINE HCL 20 MG PO TABS
20.0000 mg | ORAL_TABLET | Freq: Four times a day (QID) | ORAL | Status: DC | PRN
  Administered 2017-10-23 – 2017-10-24 (×2): 20 mg via ORAL
  Filled 2017-10-22 (×2): qty 1

## 2017-10-22 MED ORDER — ACETAMINOPHEN 325 MG PO TABS: 650.0000 mg | ORAL_TABLET | ORAL | Status: DC | PRN

## 2017-10-22 MED ORDER — LIDOCAINE 5 % EX PTCH
1.0000 | MEDICATED_PATCH | CUTANEOUS | Status: DC
  Administered 2017-10-22 – 2017-10-24 (×3): 1 via TRANSDERMAL
  Filled 2017-10-22 (×5): qty 1

## 2017-10-22 NOTE — B[facility] Group Notes (Signed)
[facility] Group Notes: (Clinical Social Work)   10/22/2017      Type of Therapy:  Group Therapy   Participation Level:  Did Not Attend despite MHT prompting   Shaunte Tuft Grossman-Orr, LCSW 10/22/2017, 11:50 AM     

## 2017-10-22 NOTE — ED Notes (Signed)
Hourly rounding reveals patient in room. No complaints, stable, in no acute distress. Q15 minute rounds and monitoring via Security Cameras to continue. 

## 2017-10-22 NOTE — Progress Notes (Signed)
D   Pt remains irritable and demanding    She curses staff when she doesn't get her way   Pt has been demanding food all day and now she is complaining of her stomach hurting her because she ate too much    She does not inter act with others and isolates to her room A   Verbal support given   Medications administered and effectiveness monitored   Q 15 min checks R   Pt is safe presently

## 2017-10-22 NOTE — BHH Suicide Risk Assessment (Signed)
[facility] Admission Suicide Risk Assessment   Nursing information obtained from:    Demographic factors:    Current Mental Status:    Loss Factors:    Historical Factors:    Risk Reduction Factors:     Total Time spent with patient: 30 minutes Principal Problem: SIMD                                  BPD Diagnosis:   Patient Active Problem List   Diagnosis Date Noted  . Severe recurrent major depression without psychotic features (HCC) [F33.2] 10/22/2017  . Periorbital cellulitis of right eye [L03.213] 09/26/2017  . Periorbital cellulitis [L03.213] 09/26/2017  . Suicidal ideation [R45.851] 03/01/2016  . UTI (urinary tract infection) [N39.0] 03/01/2016  . Bipolar 1 disorder, mixed, moderate (HCC) [F31.62]   . Opioid use disorder, moderate, dependence (HCC) [F11.20] 02/28/2016  . Sedative, hypnotic or anxiolytic use disorder, severe, dependence (HCC) [F13.20] 02/28/2016  . Substance induced mood disorder [facility]) [F19.94] 02/28/2016   Subjective Data:  42 y.o Caucasian female, unemployed. Background history of Bipolar disorder, SUD. Presented to the ER after an OD. She took $00 worth of heroine and Flexaril and passed out. Patient was at her mother's place when she did this. Her mother called for help. She was resuscitated with Narcan. UDS positive for opiates. Patient reports OD was accidental. Says she never had any intention to end her life. She has past history of accidental OD. She is still slightly drowsy.  Feels awful and irritated inside, reports aches an pains. No nausea , retching or vomiting. No diarrhea. No goose flesh, rhinorrhea. No tactile, visual or auditory hallucination. No suicidal thoughts. No homicidal thoughts. No access to weapons. She is cooperative with care. She has agreed to treatment recommendations. She has agreed to communicate suicidal thoughts of with staff if the thoughts becomes overwhelming.     Continued Clinical Symptoms:  Alcohol Use Disorder Identification  Test Final Score (AUDIT): 25 The "Alcohol Use Disorders Identification Test", Guidelines for Use in Primary Care, Second Edition.  World Science writer [facility]). Score between 0-7:  no or low risk or alcohol related problems. Score between 8-15:  moderate risk of alcohol related problems. Score between 16-19:  high risk of alcohol related problems. Score 20 or above:  warrants further diagnostic evaluation for alcohol dependence and treatment.   CLINICAL FACTORS:   Bipolar Disorder:   Mixed State Alcohol/Substance Abuse/Dependencies   Musculoskeletal: Strength & Muscle Tone: within normal limits Gait & Station: normal Patient leans: N/A  Psychiatric Specialty Exam: Physical Exam  ROS  Blood pressure (!) 116/91, pulse 88, temperature 98.2 F (36.8 C), temperature source Oral, resp. rate 15, height 5\' 4"  (1.626 m), weight 83.9 kg (185 lb), SpO2 100 %.Body mass index is 31.76 kg/m.  General Appearance: Poor personal care, minimal engagement.   Eye Contact:  Minimal  Speech:  Clear and Coherent  Volume:  Normal  Mood:  Says she feels okay  Affect:  Blunted and mood congruent  Thought Process:  Linear  Orientation:  Unable to assess at this time.   Thought Content:  No delusional theme. No preoccupation with violent thoughts. No negative ruminations. No obsession.  No hallucination in any modality.   Suicidal Thoughts:  No  Homicidal Thoughts:  No  Memory:  Unable to assess at this time.   Judgement:  Impaired  Insight:  Shallow  Psychomotor Activity:  Decreased  Concentration:  Concentration: Fair and Attention Span: Fair  Recall:  Unable to assess at this time.   Fund of Knowledge:  Fair  Language:  Fair  Akathisia:  Negative  Handed:    AIMS (if indicated):     Assets:  Resilience  ADL's:  Impaired  Cognition:  Impaired,  Mild  Sleep:  Number of Hours: 0      COGNITIVE FEATURES THAT CONTRIBUTE TO RISK:  Loss of executive function    SUICIDE RISK:   Moderate:   Frequent suicidal ideation with limited intensity, and duration, some specificity in terms of plans, no associated intent, good self-control, limited dysphoria/symptomatology, some risk factors present, and identifiable protective factors, including available and accessible social support.  PLAN OF CARE:  1. Seroquel 25 mg daily and 100 mg HS 2. Opiate withdrawal protocol 3. Continue home medical medications 4. Suicide precautions  I certify that inpatient services furnished can reasonably be expected to improve the patient's condition.   Georgiann CockerVincent A Izediuno, MD 10/22/2017, 4:48 PM
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
## 2017-10-22 NOTE — ED Provider Notes (Addendum)
Patient awake, alert, ambulatory.  Continues to endorse suicidal thoughts.  She will be transferred to psychiatric holding to continue observation for additional 2 hours.  She will be considered medically cleared as of 4:30 AM.  TTS consultation is requested.   Dione BoozeGlick, Gracious Renken, MD 10/22/17 0235   TTS consultation appreciated.  Patient qualifies for inpatient care.  They will work on appropriate placement.    Dione BoozeGlick, Latifah Padin, MD 10/22/17 [phone]

## 2017-10-22 NOTE — ED Notes (Signed)
Pt. Transferred to [facility] from ED to room 36 after screening for contraband. Report to include Situation, Background, Assessment and Recommendations from Terri RN. Pt. Oriented to unit including Q15 minute rounds as well as the security cameras for their protection. Patient is alert and oriented, warm and dry in no acute distress. Patient denies SI, HI, and AVH. Pt. Encouraged to let me know if needs arise.

## 2017-10-22 NOTE — Tx Team (Signed)
Initial Treatment Plan 10/22/2017 5:53 AM Mia [name] Facchini ZOX:[id number]    PATIENT STRESSORS: Financial difficulties Medication change or noncompliance Substance abuse   PATIENT STRENGTHS: General fund of knowledge Motivation for treatment/growth   PATIENT IDENTIFIED PROBLEMS: "get detoxed from heroine"  "why am I so irritable"                   DISCHARGE CRITERIA:  Adequate post-discharge living arrangements Improved stabilization in mood, thinking, and/or behavior Verbal commitment to aftercare and medication compliance  PRELIMINARY DISCHARGE PLAN: Attend aftercare/continuing care group Attend 12-step recovery group Placement in alternative living arrangements  PATIENT/FAMILY INVOLVEMENT: This treatment plan has been presented to and reviewed with the patient, Mia [name] Schoonmaker, and/or family member, .  The patient and family have been given the opportunity to ask questions and make suggestions.  Andrena Mewsuttall, Garmon Dehn J, RN 10/22/2017, 5:53 AM

## 2017-10-22 NOTE — BHH Counselor (Signed)
[facility] [place], [facility], assigned to room/bed: 305-1, now. Attending physician: Dr. Jama Flavorsobos. Nursing report: [phone]. Support paperwork is completed and faxed.    Redmond Pullingreylese D Adriana Quinby, MS, [facility], [facility] Triage Specialist [phone]

## 2017-10-22 NOTE — ED Notes (Signed)
Sitter at bedside.

## 2017-10-22 NOTE — Progress Notes (Signed)
Psychoeducational Group Note  Date:  10/22/2017 Time: 2045  Group Topic/Focus:  wrap up group  Participation Level: Did Not Attend  Participation Quality:  Not Applicable  Affect:  Not Applicable  Cognitive:  Not Applicable  Insight:  Not Applicable  Engagement in Group: Not Applicable  Additional Comments:  Pt was notified that group was beginning and remained in room.   Johann CapersMcNeil, Shalon Salado S 10/22/2017, 9:22 PM

## 2017-10-22 NOTE — ED Notes (Signed)
Pt in restroom at this time. Pt advising her stomach hurts.

## 2017-10-22 NOTE — ED Notes (Signed)
Patient is very fidgety-has eaten 6 sandwiches and drank multiple cups of soda-report to Jillyn HiddenGary RN Acute psych-patient to go to Room 36

## 2017-10-22 NOTE — Progress Notes (Signed)
Pt is a 42 year old female admitted with substance abuse heroine abuse  She also uses ETOH   Pt is hyper verbal and has difficulty sitting still and participating in the assessment  She was loosing concentration in the middle of signing papers   Pt had a suicide attempt which she used 130 of heroine and took 15 tramadol and 5 flexiril    Pt apparently argued with her mom and her mom got her brought to the hospital   She lives with her mom off and on and is basically homeless the rest of the time   She came on the unit demanding food and when she was told we were getting ready to go for breakfast she became angry and slammed doors and demanding to be discharged from the hospital   Her mood is labile and she is very irritable and impulsive    Pt received medications to help with her irritability and withdrawal symptoms   Verbal support given  Enforceable limits set with patient and reassurance given    Q 15 min checks started and explained
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
## 2017-10-22 NOTE — ED Notes (Signed)
[facility] Accompanied by sitter.

## 2017-10-22 NOTE — ED Provider Notes (Signed)
[facility] Provider Note   CSN: [id number] Arrival date & time: 10/22/17  1134     History   Chief Complaint Chief Complaint  Patient presents with  . [facility] pt/1st degree block    HPI Mia [name] is a 42 y.o. female.  HPI   42 year old female with past medical history of bipolar disorder and chronic substance abuse sent here from behavioral health for evaluation.  The patient is currently admitted for suicidal ideation and drug abuse.  She had an EKG which showed possible heart block and she was sent here for evaluation.  The patient currently states that she feels fine.  She strongly denies any chest pain or shortness of breath.  Denies any palpitations.  Denies any dyspnea with exertion.  She has no significant family history of coronary disease or heart block according to her report.  She does have a history of murmur, but states that this has been noted by all of her doctors in the past.  Denies any fevers or chills.  Denies any rash.  No joint aches or pains.  No other medical complaints.  Past Medical History:  Diagnosis Date  . Anxiety   . Bipolar 1 disorder (HCC)   . Cystic acne     Patient Active Problem List   Diagnosis Date Noted  . Severe recurrent major depression without psychotic features (HCC) 10/22/2017  . Periorbital cellulitis of right eye 09/26/2017  . Periorbital cellulitis 09/26/2017  . Suicidal ideation 03/01/2016  . UTI (urinary tract infection) 03/01/2016  . Bipolar 1 disorder, mixed, moderate (HCC)   . Opioid use disorder, moderate, dependence (HCC) 02/28/2016  . 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 Substance induced mood disorder (HCC) 02/28/2016    Past Surgical History:  Procedure Laterality Date  . CESAREAN SECTION      OB History    No data available       Home Medications    Prior to Admission medications   Not on File    Family  History History reviewed. No pertinent family history.  Social History Social History   Tobacco Use  . Smoking status: Current Some Day Smoker    Packs/day: 1.00    Types: Cigarettes  . Smokeless tobacco: Never Used  Substance Use Topics  . Alcohol use: No  . Drug use: No     Allergies   Patient has no known allergies.   Review of Systems Review of Systems  Constitutional: Negative for chills and fever.  HENT: Negative for congestion, rhinorrhea and sore throat.   Eyes: Negative for visual disturbance.  Respiratory: Negative for cough, shortness of breath and wheezing.   Cardiovascular: Negative for chest pain and leg swelling.  Gastrointestinal: Negative for abdominal pain, diarrhea, nausea and vomiting.  Genitourinary: Negative for dysuria, flank pain, vaginal bleeding and vaginal discharge.  Musculoskeletal: Negative for neck pain.  Skin: Negative for rash.  Allergic/Immunologic: Negative for immunocompromised state.  Neurological: Negative for syncope and headaches.  Hematological: Does not bruise/bleed easily.  All other systems reviewed and are negative.    Physical Exam Updated Vital Signs BP (!) 116/91   Pulse 88   Temp 98.2 F (36.8 C) (Oral)   Resp 15   Ht 5\' 4"  (1.626 m)   Wt 83.9 kg (185 lb)   SpO2 100%   BMI 31.76 kg/m   Physical Exam  Constitutional: She is oriented to person, place, and time. She appears  well-developed and well-nourished. No distress.  HENT:  Head: Normocephalic and atraumatic.  Eyes: Conjunctivae are normal.  Neck: Neck supple.  Cardiovascular: Normal rate, regular rhythm and normal heart sounds. Exam reveals no friction rub.  No murmur heard. Faint, systolic murmur heard best at the upper sternal border  Pulmonary/Chest: Effort normal and breath sounds normal. No respiratory distress. She has no wheezes. She has no rales.  Abdominal: She exhibits no distension.  Musculoskeletal: She exhibits no edema.  Neurological: She  is alert and oriented to person, place, and time. She exhibits normal muscle tone.  Skin: Skin is warm. Capillary refill takes less than 2 seconds.  Psychiatric: She has a normal mood and affect.  Nursing note and vitals reviewed.    ED Treatments / Results  Labs (all labs ordered are listed, but only abnormal results are displayed) Labs Reviewed  I-STAT CHEM 8, ED - Abnormal; Notable for the following components:      Result Value   Chloride 99 (*)    BUN 22 (*)    All other components within normal limits  I-STAT TROPONIN, ED    EKG  EKG Interpretation  Date/Time:  Saturday October 22 2017 13:53:36 EST Ventricular Rate:  82 PR Interval:    QRS Duration: 92 QT Interval:  386 QTC Calculation: 451 R Axis:   89 Text Interpretation:  Sinus rhythm Prolonged PR interval No significant change since last tracing Confirmed by Shaune Pollack [phone]) on 10/22/2017 2:06:23 PM       Radiology No results found.  Procedures Procedures (including critical care time)  Medications Ordered in ED Medications  acetaminophen (TYLENOL) tablet 650 mg (not administered)  alum & mag hydroxide-simeth (MAALOX/MYLANTA) 200-200-20 MG/5ML suspension 30 mL (not administered)  magnesium hydroxide (MILK OF MAGNESIA) suspension 30 mL (not administered)  dicyclomine (BENTYL) tablet 20 mg (not administered)  hydrOXYzine (ATARAX/VISTARIL) tablet 25 mg (25 mg Oral Given 10/22/17 0606)  loperamide (IMODIUM) capsule 2-4 mg (not administered)  methocarbamol (ROBAXIN) tablet 500 mg (500 mg Oral Given 10/22/17 0606)  naproxen (NAPROSYN) tablet 500 mg (500 mg Oral Given 10/22/17 0606)  ondansetron (ZOFRAN-ODT) disintegrating tablet 4 mg (not administered)  cloNIDine (CATAPRES) tablet 0.1 mg (0.1 mg Oral Given 10/22/17 0853)    Followed by  cloNIDine (CATAPRES) tablet 0.1 mg (not administered)    Followed by  cloNIDine (CATAPRES) tablet 0.1 mg (not administered)  Influenza vac split quadrivalent PF (FLUARIX)  injection 0.5 mL (not administered)  QUEtiapine (SEROQUEL) tablet 25 mg (not administered)  LORazepam (ATIVAN) tablet 1 mg (1 mg Oral Given 10/22/17 0606)     Initial Impression / Assessment and Plan / ED Course  I have reviewed the triage vital signs and the nursing notes.  Pertinent labs & imaging results that were available during my care of the patient were reviewed by me and considered in my medical decision making (see chart for details).     42 year old female here with possible abnormal EKG.  Patient accepted at behavioral health.  On my review of EKGs, the patient has a benign first-degree heart block.  QRS is narrow.  There is no evidence of arrhythmia on telemetry.  Her screening lab work is unremarkable.  She has no signs or symptoms to suggest arrhythmia or ischemia.  Of note, she does have a very faint murmur.  She states this is been noted multiple times in the past.  She has no fever, chills, rash, joint aches, or other symptoms to suggest endocarditis and I suspect this  is likely a benign flow murmur.  However, I do think it would be pertinent to refer her to a PCP for echocardiogram as an outpatient.  However, she does not need this emergently given absence of any other concerning symptoms, and she is safe and stable for return to behavioral health.  This note was prepared with assistance of Conservation officer, historic buildings. Occasional wrong-word or sound-a-like substitutions may have occurred due to the inherent limitations of voice recognition software.   Final Clinical Impressions(s) / ED Diagnoses   Final diagnoses:  First degree heart block  Murmur    ED Discharge Orders    None       Shaune Pollack, MD 10/22/17 1411

## 2017-10-22 NOTE — Progress Notes (Signed)
D: EKG ordered and done for Pt. Sinus rhythm with 1st degree AV block Left posterior fascicular block. Non specific ST abnormality.  Abnormal ECG. A: Information shared with the NP. And [facility] , [facility] emergent for evaluation. Mother is being notified by Marlyn CorporalPatricia Duke RN. R [facility].

## 2017-10-22 NOTE — Progress Notes (Signed)
D: Pt up at the medication window, falling asleep standing up. States, "I used a bunch of heroine yesterday and I just feel high. Very high. I keep falling asleep". Pt is demanding to see the doctor and stating, "I want to get out of the f--ing place. All I have to do is scream and scream and they will let me out. I have money I need to make. 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 Pt was also on the phone to her mother and was yelling at her. Pt states, : I take medication for my anxiety. I want my mediation for my anxiety now". A: Gentle approach used with Pt. Giving Pt what can be given and gently directing her to look at other options. Providing Pt with a 1:1. Providing space when needed by patient. Using a soft gentle voice with Pt. D) Pt irrational tangential and angry, flight of ideas and grandiose at times.

## 2017-10-22 NOTE — Progress Notes (Signed)
D) Pt returned from the ED. All labs were within normal limits and Pt was instructed to follow up with her PCP for an echo cardiogram. On return Pt was angry, wanting her medications and saying, "I will get out of here if I have to scream and yell and holler until you let me go" Proceeded up the hall banging the door and cursing about being here. A) Pt given some crackers and her medications after being put back in the system.  R) Was able to attempt to sleep after receiving the medications and after a 1:1.

## 2017-10-22 NOTE — ED Notes (Signed)
Pt. Upset about snack rules despite multiple snacks given in ED. Cursing loudly even after being informed of other patients sleeping.

## 2017-10-22 NOTE — ED Notes (Signed)
[facility] revealed [facility]st degree block.   PT reports having abdominal pain and back pain x 1 year. Pt states she does not know if she is suicidal. Pt states she was not attempting to harm self with heroin over dose last night.   115/67 Hr 80 1st degree [id number] #20 LH

## 2017-10-23 DIAGNOSIS — F1994 Other psychoactive substance use, unspecified with psychoactive substance-induced mood disorder: Secondary | ICD-10-CM

## 2017-10-23 DIAGNOSIS — F39 Unspecified mood [affective] disorder: Secondary | ICD-10-CM

## 2017-10-23 DIAGNOSIS — R51 Headache: Secondary | ICD-10-CM

## 2017-10-23 DIAGNOSIS — R450 Nervousness: Secondary | ICD-10-CM

## 2017-10-23 LAB — LIPID PANEL
CHOLESTEROL: 168 mg/dL (ref 0–200)
HDL: 61 mg/dL (ref >40)
LDL Cholesterol: 89 mg/dL (ref 0–99)
Total CHOL/HDL Ratio: 2.8 RATIO
Triglycerides: 92 mg/dL (ref <150)
VLDL: 18 mg/dL (ref 0–40)

## 2017-10-23 LAB — TSH: TSH: 1.146 u[IU]/mL (ref 0.350–4.500)

## 2017-10-23 LAB — HIV ANTIBODY (ROUTINE TESTING W REFLEX): HIV Screen 4th Generation wRfx: NONREACTIVE

## 2017-10-23 LAB — HEMOGLOBIN A1C
HEMOGLOBIN A1C: 5.2 % (ref 4.8–5.6)
MEAN PLASMA GLUCOSE: 102.54 mg/dL
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
## 2017-10-23 NOTE — Progress Notes (Signed)
Patient did not attend the evening speaker AA meeting. Pt remained sleeping during group time.   

## 2017-10-23 NOTE — Progress Notes (Signed)
D: Pt was in bed in her room upon initial approach.  Pt presents with anxious, depressed affect and mood.  She reports goal is to "get better" and she feels "a little bit" better today than she did yesterday.  Pt denies SI/HI, denies hallucinations, denies pain.  She reports withdrawal symptom of muscle spasms and states "I don't feel good, I'm tired."   Pt has been visible in milieu periodically with few peer interactions. .   A: Introduced self to pt.  Actively listened to pt and offered support and encouragement. Medications administered per order.  PRN medication administered for anxiety and muscle spasms.  Q15 minute safety checks maintained.  R: Pt is safe on the unit.  Pt is compliant with medications.  Pt verbally contracts for safety.  Will continue to monitor and assess.

## 2017-10-23 NOTE — [facility] Group Notes (Signed)
[facility] LCSW Group Therapy Note  Date/Time:  10/23/2017 10:00-11:00AM  Type of Therapy and Topic:  Group Therapy:  Healthy and Unhealthy Supports  Participation Level:  Did Not Attend   Description of Group:  Patients in this group were introduced to the idea of adding a variety of healthy supports to address the various needs in their lives. The picture on the front of Sunday's workbook was used to demonstrate why more supports are needed in every patient's life.  Patients identified and described healthy supports versus unhealthy supports in general, then gave examples of each in their own lives.   They discussed what additional healthy supports could be helpful in their recovery and wellness after discharge in order to prevent future hospitalizations.   An emphasis was placed on using counselor, doctor, therapy groups, 12-step groups, and problem-specific support groups to expand supports.  They also worked as a group on developing a specific plan for several patients to deal with unhealthy supports through boundary-setting, psychoeducation with loved ones, and even termination of relationships.   Therapeutic Goals:   1)  discuss importance of adding supports to stay well once out of the hospital  2)  compare healthy versus unhealthy supports and identify some examples of each  3)  generate ideas and descriptions of healthy supports that can be added  4)  offer mutual support about how to address unhealthy supports  5)  encourage active participation in and adherence to discharge plan    Summary of Patient Progress:  N/A   Therapeutic Modalities:   Motivational Interviewing Brief Solution-Focused Therapy  Renate Danh Grossman-Orr, LCSW        

## 2017-10-23 NOTE — Progress Notes (Signed)
D) Pt has been in the bed most of the shift. Did not go to lunch, it was brought back for her. Pt is sleeping and when awakened states, "I am happy to be sleeping this off". Pt is much subdued this shift and has stayed to herself. Has not attended groups. Pt refused her Influenza injection today stating, "I don't want anyonwe to mess with me. Pt rates her depression, hopelessness and anxiety all at a 0. Denies SI and HI. A) Given support, reassurance and praise. Provided we a brief 1:1. Encouragement given. Pt allowed to sleep. R) Sleeping much of the shift. States she is feeling much better since she is sleeping.

## 2017-10-23 NOTE — Progress Notes (Signed)
[facility] MD Progress Note  10/23/2017 12:33 PM Paula Comptonabitha A Goodwyn  MRN:  [id number]   Subjective:  Mia [name] reports " I feeling okay, I just have a headache."    Objective:Layni A Mayford KnifeWilliams is awake, alert and oriented. Seen resting in bed. Reports she feels like she is still going through withdrawals and has a headache today.However reports over all she is feeling better. Reports her mood is improving.  Denies suicidal or homicidal ideation. Denies auditory or visual hallucination and does not appear to be responding to internal stimuli.  Reports taken and tolerating the Seroquel well. Report her depression is about 5/10 today. Patient presents with less irritability than yesterday. Support, encouragement and reassurance was provided.   Principal Problem: Substance induced mood disorder (HCC) Diagnosis:   Patient Active Problem List   Diagnosis Date Noted  . Severe recurrent major depression without psychotic features (HCC) [F33.2] 10/22/2017  . Periorbital cellulitis of right eye [L03.213] 09/26/2017  . Periorbital cellulitis [L03.213] 09/26/2017  . Suicidal ideation [R45.851] 03/01/2016  . UTI (urinary tract infection) [N39.0] 03/01/2016  . Bipolar 1 disorder, mixed, moderate (HCC) [F31.62]   . Opioid use disorder, moderate, dependence (HCC) [F11.20] 02/28/2016  . Sedative, hypnotic or anxiolytic use disorder, severe, dependence (HCC) [F13.20] 02/28/2016  . Substance induced mood disorder (HCC) [F19.94] 02/28/2016   Total Time spent with patient: 20 minutes  Past Psychiatric History:   Past Medical History:  Past Medical History:  Diagnosis Date  . Anxiety   . Bipolar 1 disorder (HCC)   . Cystic acne     Past Surgical History:  Procedure Laterality Date  . CESAREAN SECTION     Family History: History reviewed. No pertinent family history. Family Psychiatric  History:  Social History:  Social History   Substance and Sexual Activity  Alcohol Use No     Social History    Substance and Sexual Activity  Drug Use No    Social History   Socioeconomic History  . Marital status: Single    Spouse name: None  . Number of children: None  . Years of education: None  . Highest education level: None  Social Needs  . Financial resource strain: None  . Food insecurity - worry: None  . Food insecurity - inability: None  . Transportation needs - medical: None  . Transportation needs - non-medical: None  Occupational History  . None  Tobacco Use  . Smoking status: Current Some Day Smoker    Packs/day: 1.00    Types: Cigarettes  . Smokeless tobacco: Never Used  Substance and Sexual Activity  . Alcohol use: No  . Drug use: No  . Sexual activity: None  Other Topics Concern  . None  Social History Narrative  . None   Additional Social History:    Pain Medications: See MAR Prescriptions: See MAR Over the Counter: See MAR History of alcohol / drug use?: Yes Negative Consequences of Use: Financial, Personal relationships Withdrawal Symptoms: Agitation, Irritability Name of Substance 1: Heroin 1 - Age of First Use: UTA 1 - Amount (size/oz): Pt reported, she recently started back using. Pt reported, snorting $60.  1 - Frequency: UTA 1 - Duration: UTA 1 - Last Use / Amount: Pt reported, 10/21/2017.                  Sleep: Fair  Appetite:  Fair  Current Medications: Current Facility-Administered Medications  Medication Dose Route Frequency Provider Last Rate Last Dose  . acetaminophen (TYLENOL) tablet 650 mg  650 mg Oral Q6H PRN Nira Conn A, NP   650 mg at 10/22/17 1537  . alum & mag hydroxide-simeth (MAALOX/MYLANTA) 200-200-20 MG/5ML suspension 30 mL  30 mL Oral Q4H PRN Nira Conn A, NP      . cloNIDine (CATAPRES) tablet 0.1 mg  0.1 mg Oral QID Nira Conn A, NP   0.1 mg at 10/22/17 2324   Followed by  . [START ON 10/24/2017] cloNIDine (CATAPRES) tablet 0.1 mg  0.1 mg Oral BH-qamhs Jackelyn Poling, NP       Followed by  . [START ON  10/26/2017] cloNIDine (CATAPRES) tablet 0.1 mg  0.1 mg Oral QAC breakfast Nira Conn A, NP      . dicyclomine (BENTYL) tablet 20 mg  20 mg Oral Q6H PRN Nira Conn A, NP   20 mg at 10/23/17 0202  . hydrOXYzine (ATARAX/VISTARIL) tablet 25 mg  25 mg Oral Q6H PRN Nira Conn A, NP   25 mg at 10/23/17 0202  . Influenza vac split quadrivalent PF (FLUARIX) injection 0.5 mL  0.5 mL Intramuscular Tomorrow-1000 Nira Conn A, NP      . lidocaine (LIDODERM) 5 % 1 patch  1 patch Transdermal Q24H Oneta Rack, NP   1 patch at 10/22/17 2323  . loperamide (IMODIUM) capsule 2-4 mg  2-4 mg Oral PRN Nira Conn A, NP   4 mg at 10/23/17 0202  . magnesium hydroxide (MILK OF MAGNESIA) suspension 30 mL  30 mL Oral Daily PRN Nira Conn A, NP      . methocarbamol (ROBAXIN) tablet 500 mg  500 mg Oral Q8H PRN Nira Conn A, NP   500 mg at 10/22/17 1952  . naproxen (NAPROSYN) tablet 500 mg  500 mg Oral BID PRN Nira Conn A, NP   500 mg at 10/23/17 0202  . nicotine (NICODERM CQ - dosed in mg/24 hours) patch 21 mg  21 mg Transdermal Daily Nira Conn A, NP      . ondansetron (ZOFRAN-ODT) disintegrating tablet 4 mg  4 mg Oral Q6H PRN Nira Conn A, NP      . QUEtiapine (SEROQUEL) tablet 100 mg  100 mg Oral QHS Izediuno, Delight Ovens, MD   100 mg at 10/22/17 2323  . QUEtiapine (SEROQUEL) tablet 25 mg  25 mg Oral Daily Izediuno, Delight Ovens, MD        Lab Results:  Results for orders placed or performed during the hospital encounter of 10/22/17 (from the past 48 hour(s))  I-Stat Troponin, ED (not at [facility])     Status: None   Collection Time: 10/22/17  1:20 PM  Result Value Ref Range   Troponin i, poc 0.00 0.00 - 0.08 ng/mL   Comment 3            Comment: Due to the release kinetics of cTnI, a negative result within the first hours of the onset of symptoms does not rule out myocardial infarction with certainty. If myocardial infarction is still suspected, repeat the test at appropriate intervals.   I-Stat Chem  8, ED     Status: Abnormal   Collection Time: 10/22/17  1:22 PM  Result Value Ref Range   Sodium 137 135 - 145 mmol/L   Potassium 4.2 3.5 - 5.1 mmol/L   Chloride 99 (L) 101 - 111 mmol/L   BUN 22 (H) 6 - 20 mg/dL   Creatinine, Ser 9.62 0.44 - 1.00 mg/dL   Glucose, Bld 92 65 - 99 mg/dL   Calcium, Ion 9.52  1.15 - 1.40 mmol/L   TCO2 30 22 - 32 mmol/L   Hemoglobin 12.9 12.0 - 15.0 g/dL   HCT 16.1 09.6 - 04.5 %  TSH     Status: None   Collection Time: 10/23/17  6:48 AM  Result Value Ref Range   TSH 1.146 0.350 - 4.500 uIU/mL    Comment: Performed by a 3rd Generation assay with a functional sensitivity of <=0.01 uIU/mL. Performed at [facility], [address] [place], [place] [address]   Hemoglobin A1c     Status: None   Collection Time: 10/23/17  6:48 AM  Result Value Ref Range   Hgb A1c MFr Bld 5.2 4.8 - 5.6 %    Comment: (NOTE) Pre diabetes:          5.7%-6.4% Diabetes:              >6.4% Glycemic control for   <7.0% adults with diabetes    Mean Plasma Glucose 102.54 mg/dL    Comment: Performed at [facility], [address] [place], [place] [address]  Lipid panel     Status: None   Collection Time: 10/23/17  6:48 AM  Result Value Ref Range   Cholesterol 168 0 - 200 mg/dL   Triglycerides 92 <829 mg/dL   HDL 61 >56 mg/dL   Total CHOL/HDL Ratio 2.8 RATIO   VLDL 18 0 - 40 mg/dL   LDL Cholesterol 89 0 - 99 mg/dL    Comment:        Total Cholesterol/HDL:CHD Risk Coronary Heart Disease Risk Table                     Men   Women  1/2 Average Risk   3.4   3.3  Average Risk       5.0   4.4  2 X Average Risk   9.6   7.1  3 X Average Risk  23.4   11.0        Use the calculated Patient Ratio above and the CHD Risk Table to determine the patient's CHD Risk.        ATP III CLASSIFICATION (LDL):  <100     mg/dL   Optimal  213-086  mg/dL   Near or Above                    Optimal  130-159  mg/dL   Borderline  578-469  mg/dL   High  >629     mg/dL    Very High Performed at [facility], [address], [place], [place] [address]     Blood Alcohol level:  Lab Results  Component Value Date   ETH <10 10/21/2017   ETH <5 02/27/2016    Metabolic Disorder Labs: Lab Results  Component Value Date   HGBA1C 5.2 10/23/2017   MPG 102.54 10/23/2017   Lab Results  Component Value Date   PROLACTIN 151.1 (H) 03/03/2016   Lab Results  Component Value Date   CHOL 168 10/23/2017   TRIG 92 10/23/2017   HDL 61 10/23/2017   CHOLHDL 2.8 10/23/2017   VLDL 18 10/23/2017   LDLCALC 89 10/23/2017   LDLCALC 176 (H) 03/03/2016    Physical Findings: AIMS: Facial and Oral Movements Muscles of Facial Expression: (P) None, normal Lips and Perioral Area: (P) None, normal Jaw: (P) None, normal Tongue: (P) None, normal,Extremity Movements Upper (arms, wrists, hands, fingers): (P) None, normal Lower (legs,  knees, ankles, toes): (P) None, normal, Trunk Movements Neck, shoulders, hips: (P) None, normal, Overall Severity Severity of abnormal movements (highest score from questions above): (P) None, normal Incapacitation due to abnormal movements: (P) None, normal Patient's awareness of abnormal movements (rate only patient's report): (P) No Awareness, Dental Status Current problems with teeth and/or dentures?: No Does patient usually wear dentures?: No  CIWA:  CIWA-Ar Total: 10 COWS:  COWS Total Score: 16  Musculoskeletal: Strength & Muscle Tone: within normal limits Gait & Station: normal Patient leans: N/A  Psychiatric Specialty Exam: Physical Exam  Vitals reviewed. Constitutional: She appears well-developed.  HENT:  Head: Normocephalic.  Cardiovascular: Normal rate.  Psychiatric: She has a normal mood and affect. Her behavior is normal.    Review of Systems  Psychiatric/Behavioral: Positive for depression and substance abuse. The patient is nervous/anxious.     Blood pressure (!) 109/57, pulse 83, temperature 98.2  F (36.8 C), temperature source Oral, resp. rate 15, height 5\' 4"  (1.626 m), weight 83.9 kg (185 lb), SpO2 100 %.Body mass index is 31.76 kg/m.  General Appearance: Casual  Eye Contact:  Good  Speech:  Clear and Coherent  Volume:  Normal  Mood:  Anxious and Depressed improving   Affect:  Congruent  Thought Process:  Coherent  Orientation:  Full (Time, Place, and Person)  Thought Content:  Hallucinations: None  Suicidal Thoughts:  No  Homicidal Thoughts:  No  Memory:  Immediate;   Fair Recent;   Fair  Judgement:  Fair  Insight:  Good  Psychomotor Activity:  Normal  Concentration:  Concentration: Fair  Recall:  [employer] of Knowledge:  Fair  Language:  Fair  Akathisia:  No  Handed:  Right  AIMS (if indicated):     Assets:  Communication Skills Desire for Improvement Physical Health Social Support  ADL's:  Intact  Cognition:  WNL  Sleep:  Number of Hours: 4.75     Treatment Plan Summary: Daily contact with patient to assess and evaluate symptoms and progress in treatment and Medication management   Continue with current treatment plan on 10/23/2017 except where noted   Continue Seroquel 25 mg PO BID and 100 mg QHS for mood stabilization Continue with Trazodone 50 mg  for insomnia Started on COWS/ [place] Protocol Will continue to monitor vitals ,medication compliance and treatment side effects while patient is here.  Reviewed labs: ,BAL - , UDS - pos for opiates CSW will start working on disposition.  Patient to participate in therapeutic milieu   Oneta Rack, NP 10/23/2017, 12:33 PM

## 2017-10-23 NOTE — Plan of Care (Signed)
  Progressing Safety: Periods of time without injury will increase 10/23/2017 2219 - Progressing by Arrie Aranhurch, Laryn Venning J, RN Note Pt has not harmed self or others tonight.  She denies SI/HI and verbally contracts for safety.

## 2017-10-24 DIAGNOSIS — T481X1A Poisoning by skeletal muscle relaxants [neuromuscular blocking agents], accidental (unintentional), initial encounter: Secondary | ICD-10-CM

## 2017-10-24 DIAGNOSIS — F319 Bipolar disorder, unspecified: Secondary | ICD-10-CM

## 2017-10-24 LAB — HEPATITIS C ANTIBODY: HCV Ab: >11 s/co ratio — ABNORMAL HIGH (ref 0.0–0.9)

## 2017-10-24 LAB — PROLACTIN: PROLACTIN: 8.5 ng/mL (ref 4.8–23.3)

## 2017-10-24 NOTE — Progress Notes (Signed)
[facility]  10/24/2017 3:07 PM Mia [name]  MRN:  [id number] Subjective:    Mia [name] is a 42 y/o F with history of bipolar disorder and opioid use disorder who was admitted with worsening depression and unintentional overdose of flexeril and heroin. Pt was restarted on seroquel and placed on opioid withdrawal protocol with clonidine. She has been observed on the inpatient psychiatry unit.  Today upon evaluation, pt reports she is doing "fine." She is irritable, guarded, and minimally cooperative with sharing her history of initial presentation to this provider. She states, "My mom called 911, I guess I had passed out; I took a few muscle relaxers - not more than I normally take." Pt denies SI/HI/AH/VH, but she continues to rate her depression "7 out of 10" today. She denies that any portion of her behavior of overdose was related to a desire of wanting to die. Pt shares that she is not planning on having substance use treatment as part of her follow up and she does not feel that she has a problem with substance use. She notes that she uses about $20/week of heroin. Pt states she would be willing to go to AA and NA meetings after discharge. Discussed with patient that we will plan to continue her current regimen without changes at this time, with plan to discharge to outpatient level of care if she has ongoing stability of her mood symptoms.    Principal Problem: Substance induced mood disorder (HCC) Diagnosis:   Patient Active Problem List   Diagnosis Date Noted  . Severe recurrent major depression without psychotic features (HCC) [F33.2] 10/22/2017  . Periorbital cellulitis of right eye [L03.213] 09/26/2017  . Periorbital cellulitis [L03.213] 09/26/2017  . Suicidal ideation [R45.851] 03/01/2016  . UTI (urinary tract infection) [N39.0] 03/01/2016  . Bipolar 1 disorder, mixed, moderate (HCC) [F31.62]   . Opioid use disorder, moderate, dependence (HCC) [F11.20] 02/28/2016   . Sedative, hypnotic or anxiolytic use disorder, severe, dependence (HCC) [F13.20] 02/28/2016  . Substance induced mood disorder (HCC) [F19.94] 02/28/2016   Total Time spent with patient: 30 minutes  Past Psychiatric History: see H&P  Past Medical History:  Past Medical History:  Diagnosis Date  . Anxiety   . Bipolar 1 disorder (HCC)   . Cystic acne     Past Surgical History:  Procedure Laterality Date  . CESAREAN SECTION     Family History: History reviewed. No pertinent family history. Family Psychiatric  History: see H&P Social History:  Social History   Substance and Sexual Activity  Alcohol Use No     Social History   Substance and Sexual Activity  Drug Use No    Social History   Socioeconomic History  . Marital status: Single    Spouse name: None  . Number of children: None  . Years of education: None  . Highest education level: None  Social Needs  . Financial resource strain: None  . Food insecurity - worry: None  . Food insecurity - inability: None  . Transportation needs - medical: None  . Transportation needs - non-medical: None  Occupational History  . None  Tobacco Use  . Smoking status: Current Some Day Smoker    Packs/day: 1.00    Types: Cigarettes  . Smokeless tobacco: Never Used  Substance and Sexual Activity  . Alcohol use: No  . Drug use: No  . Sexual activity: None  Other Topics Concern  . None  Social History Narrative  . None  Additional Social History:    Pain Medications: See MAR Prescriptions: See MAR Over the Counter: See MAR History of alcohol / drug use?: Yes Negative Consequences of Use: Financial, Personal relationships Withdrawal Symptoms: Agitation, Irritability Name of Substance 1: Heroin 1 - Age of First Use: UTA 1 - Amount (size/oz): Pt reported, she recently started back using. Pt reported, snorting $60.  1 - Frequency: UTA 1 - Duration: UTA 1 - Last Use / Amount: Pt reported, 10/21/2017.                   Sleep: Fair  Appetite:  Fair  Current Medications: Current Facility-Administered Medications  Medication Dose Route Frequency Provider Last Rate Last Dose  . acetaminophen (TYLENOL) tablet 650 mg  650 mg Oral Q6H PRN Nira Conn A, NP   650 mg at 10/22/17 1537  . alum & mag hydroxide-simeth (MAALOX/MYLANTA) 200-200-20 MG/5ML suspension 30 mL  30 mL Oral Q4H PRN Nira Conn A, NP      . cloNIDine (CATAPRES) tablet 0.1 mg  0.1 mg Oral BH-qamhs Nira Conn A, NP   0.1 mg at 10/24/17 1024   Followed by  . [START ON 10/26/2017] cloNIDine (CATAPRES) tablet 0.1 mg  0.1 mg Oral QAC breakfast Nira Conn A, NP      . dicyclomine (BENTYL) tablet 20 mg  20 mg Oral Q6H PRN Nira Conn A, NP   20 mg at 10/24/17 1031  . hydrOXYzine (ATARAX/VISTARIL) tablet 25 mg  25 mg Oral Q6H PRN Nira Conn A, NP   25 mg at 10/24/17 1031  . Influenza vac split quadrivalent PF (FLUARIX) injection 0.5 mL  0.5 mL Intramuscular Tomorrow-1000 Nira Conn A, NP      . lidocaine (LIDODERM) 5 % 1 patch  1 patch Transdermal Q24H Oneta Rack, NP   1 patch at 10/23/17 1700  . loperamide (IMODIUM) capsule 2-4 mg  2-4 mg Oral PRN Nira Conn A, NP   2 mg at 10/24/17 1031  . magnesium hydroxide (MILK OF MAGNESIA) suspension 30 mL  30 mL Oral Daily PRN Nira Conn A, NP      . methocarbamol (ROBAXIN) tablet 500 mg  500 mg Oral Q8H PRN Nira Conn A, NP   500 mg at 10/23/17 2106  . naproxen (NAPROSYN) tablet 500 mg  500 mg Oral BID PRN Nira Conn A, NP   500 mg at 10/24/17 1031  . nicotine (NICODERM CQ - dosed in mg/24 hours) patch 21 mg  21 mg Transdermal Daily Nira Conn A, NP   21 mg at 10/24/17 1023  . ondansetron (ZOFRAN-ODT) disintegrating tablet 4 mg  4 mg Oral Q6H PRN Nira Conn A, NP   4 mg at 10/24/17 1032  . QUEtiapine (SEROQUEL) tablet 100 mg  100 mg Oral QHS Izediuno, Delight Ovens, MD   100 mg at 10/23/17 2105  . QUEtiapine (SEROQUEL) tablet 25 mg  25 mg Oral Daily Izediuno, Delight Ovens, MD   25 mg at  10/24/17 1028    Lab Results:  Results for orders placed or performed during the hospital encounter of 10/22/17 (from the past 48 hour(s))  TSH     Status: None   Collection Time: 10/23/17  6:48 AM  Result Value Ref Range   TSH 1.146 0.350 - 4.500 uIU/mL    Comment: Performed by a 3rd Generation assay with a functional sensitivity of <=0.01 uIU/mL. Performed at [facility], [address] [place], [place] [address]   HIV antibody  Status: None   Collection Time: 10/23/17  6:48 AM  Result Value Ref Range   HIV Screen 4th Generation wRfx Non Reactive Non Reactive    Comment: (NOTE) Performed At: [facility] [address] [place], [place] [id number] Jolene Schimke MD WU:[id number] Performed at [facility], [address], [place], [place] [address]   Hepatitis C antibody     Status: Abnormal   Collection Time: 10/23/17  6:48 AM  Result Value Ref Range   HCV Ab >11.0 (H) 0.0 - 0.9 s/co ratio    Comment: (NOTE)                                  Negative:     < 0.8                             Indeterminate: 0.8 - 0.9                                  Positive:     > 0.9 The CDC recommends that a positive HCV antibody result be followed up with a HCV Nucleic Acid Amplification test (308657). Performed At: [facility] [address] [place], [place] [id number] Jolene Schimke MD [id number] Performed at [facility], [address] [place], [place] [address]   Prolactin     Status: None   Collection Time: 10/23/17  6:48 AM  Result Value Ref Range   Prolactin 8.5 4.8 - 23.3 ng/mL    Comment: (NOTE) Performed At: [facility] [address] [place], [place] [id number] Jolene Schimke MD ZD:[id number] Performed at [facility], [address] [place], [place] [address]   Hemoglobin A1c     Status: None   Collection Time: 10/23/17  6:48 AM  Result Value Ref Range    Hgb A1c MFr Bld 5.2 4.8 - 5.6 %    Comment: (NOTE) Pre diabetes:          5.7%-6.4% Diabetes:              >6.4% Glycemic control for   <7.0% adults with diabetes    Mean Plasma Glucose 102.54 mg/dL    Comment: Performed at [facility], [address] [place], [place] [address]  Lipid panel     Status: None   Collection Time: 10/23/17  6:48 AM  Result Value Ref Range   Cholesterol 168 0 - 200 mg/dL   Triglycerides 92 <630 mg/dL   HDL 61 >16 mg/dL   Total CHOL/HDL Ratio 2.8 RATIO   VLDL 18 0 - 40 mg/dL   LDL Cholesterol 89 0 - 99 mg/dL    Comment:        Total Cholesterol/HDL:CHD Risk Coronary Heart Disease Risk Table                     Men   Women  1/2 Average Risk   3.4   3.3  Average Risk       5.0   4.4  2 X Average Risk   9.6   7.1  3 X Average Risk  23.4   11.0        Use the calculated Patient Ratio above and the CHD Risk Table to determine the patient's CHD Risk.  ATP III CLASSIFICATION (LDL):  <100     mg/dL   Optimal  409-811  mg/dL   Near or Above                    Optimal  130-159  mg/dL   Borderline  914-782  mg/dL   High  >956     mg/dL   Very High Performed at [facility], [address], [place], [place] [address]     Blood Alcohol level:  Lab Results  Component Value Date   ETH <10 10/21/2017   ETH <5 02/27/2016    Metabolic Disorder Labs: Lab Results  Component Value Date   HGBA1C 5.2 10/23/2017   MPG 102.54 10/23/2017   Lab Results  Component Value Date   PROLACTIN 8.5 10/23/2017   PROLACTIN 151.1 (H) 03/03/2016   Lab Results  Component Value Date   CHOL 168 10/23/2017   TRIG 92 10/23/2017   HDL 61 10/23/2017   CHOLHDL 2.8 10/23/2017   VLDL 18 10/23/2017   LDLCALC 89 10/23/2017   LDLCALC 176 (H) 03/03/2016    Physical Findings: AIMS: Facial and Oral Movements Muscles of Facial Expression: None, normal Lips and Perioral Area: None, normal Jaw: None, normal Tongue: None, normal,Extremity  Movements Upper (arms, wrists, hands, fingers): None, normal Lower (legs, knees, ankles, toes): None, normal, Trunk Movements Neck, shoulders, hips: None, normal, Overall Severity Severity of abnormal movements (highest score from questions above): None, normal Incapacitation due to abnormal movements: None, normal Patient's awareness of abnormal movements (rate only patient's report): No Awareness, Dental Status Current problems with teeth and/or dentures?: No Does patient usually wear dentures?: No  CIWA:  CIWA-Ar Total: 10 COWS:  COWS Total Score: 5  Musculoskeletal: Strength & Muscle Tone: within normal limits Gait & Station: normal Patient leans: N/A  Psychiatric Specialty Exam: Physical Exam  Nursing note and vitals reviewed.   Review of Systems  Constitutional: Negative for chills and fever.  Respiratory: Negative for cough.   Cardiovascular: Negative for chest pain and palpitations.  Gastrointestinal: Negative for diarrhea, heartburn, nausea and vomiting.  Psychiatric/Behavioral: Negative for depression and suicidal ideas.    Blood pressure (!) 151/59, pulse 96, temperature 98.2 F (36.8 C), temperature source Oral, resp. rate 15, height 5\' 4"  (1.626 m), weight 83.9 kg (185 lb), SpO2 100 %.Body mass index is 31.76 kg/m.  General Appearance: Casual and Fairly Groomed  Eye Contact:  Good  Speech:  Clear and Coherent and Normal Rate  Volume:  Normal  Mood:  Euthymic  Affect:  Appropriate and Congruent  Thought Process:  Coherent and Goal Directed  Orientation:  Full (Time, Place, and Person)  Thought Content:  Logical  Suicidal Thoughts:  No  Homicidal Thoughts:  No  Memory:  Immediate;   Good Recent;   Good Remote;   Good  Judgement:  Fair  Insight:  Fair  Psychomotor Activity:  Normal  Concentration:  Concentration: Good  Recall:  Good  Fund of Knowledge:  Good  Language:  Good  Akathisia:  No  Handed:    AIMS (if indicated):     Assets:  Investment banker, corporate Physical Health Resilience Social Support  ADL's:  Intact  Cognition:  WNL  Sleep:  Number of Hours: 5.5   Treatment Plan Summary: Daily contact with patient to assess and evaluate symptoms and progress in treatment and Medication management. Pt has ongoing depression, but she denies SI/HI/AH/VH. She is tolerating her medications without difficulty or side  effects. She is not interested in referral to treatment for opioid use, and she does not think she will stop using after discharge. We will anticipate discharge to outpatient level of care tomorrow if pt has ongoing mood stability.  - Continue inpatient hospitalization  -Bipolar disorder   - Continue Seroquel 25 mg PO BID and 100 mg QHS for mood stabilization  -Isomnia    - Continue with Trazodone50 mgfor insomnia  -Opiate use disorder    - Continue COWS/ClonidineProtocol  -Patient to participate in therapeutic milieu -Discharge planning will be ongoing  Micheal Likens, MD 10/24/2017, 3:07 PM

## 2017-10-24 NOTE — Tx Team (Signed)
Interdisciplinary Treatment and Diagnostic Plan Update  10/24/2017 Time of Session: 0830AM Mia [name] MRN: [id number]  Principal Diagnosis: Substance induced mood disorder (HCC)  Secondary Diagnoses: Principal Problem:   Substance induced mood disorder (HCC) Active Problems:   Severe recurrent major depression without psychotic features (HCC)   Current Medications:  Current Facility-Administered Medications  Medication Dose Route Frequency Provider Last Rate Last Dose  . acetaminophen (TYLENOL) tablet 650 mg  650 mg Oral Q6H PRN Nira Conn A, NP   650 mg at 10/22/17 1537  . alum & mag hydroxide-simeth (MAALOX/MYLANTA) 200-200-20 MG/5ML suspension 30 mL  30 mL Oral Q4H PRN Nira Conn A, NP      . cloNIDine (CATAPRES) tablet 0.1 mg  0.1 mg Oral BH-qamhs Nira Conn A, NP   0.1 mg at 10/24/17 1024   Followed by  . [START ON 10/26/2017] cloNIDine (CATAPRES) tablet 0.1 mg  0.1 mg Oral QAC breakfast Nira Conn A, NP      . dicyclomine (BENTYL) tablet 20 mg  20 mg Oral Q6H PRN Nira Conn A, NP   20 mg at 10/24/17 1031  . hydrOXYzine (ATARAX/VISTARIL) tablet 25 mg  25 mg Oral Q6H PRN Nira Conn A, NP   25 mg at 10/24/17 1031  . Influenza vac split quadrivalent PF (FLUARIX) injection 0.5 mL  0.5 mL Intramuscular Tomorrow-1000 Nira Conn A, NP      . lidocaine (LIDODERM) 5 % 1 patch  1 patch Transdermal Q24H Oneta Rack, NP   1 patch at 10/23/17 1700  . loperamide (IMODIUM) capsule 2-4 mg  2-4 mg Oral PRN Nira Conn A, NP   2 mg at 10/24/17 1031  . magnesium hydroxide (MILK OF MAGNESIA) suspension 30 mL  30 mL Oral Daily PRN Nira Conn A, NP      . methocarbamol (ROBAXIN) tablet 500 mg  500 mg Oral Q8H PRN Nira Conn A, NP   500 mg at 10/23/17 2106  . naproxen (NAPROSYN) tablet 500 mg  500 mg Oral BID PRN Nira Conn A, NP   500 mg at 10/24/17 1031  . nicotine (NICODERM CQ - dosed in mg/24 hours) patch 21 mg  21 mg Transdermal Daily Nira Conn A, NP   21 mg at  10/24/17 1023  . ondansetron (ZOFRAN-ODT) disintegrating tablet 4 mg  4 mg Oral Q6H PRN Nira Conn A, NP   4 mg at 10/24/17 1032  . QUEtiapine (SEROQUEL) tablet 100 mg  100 mg Oral QHS Izediuno, Delight Ovens, MD   100 mg at 10/23/17 2105  . QUEtiapine (SEROQUEL) tablet 25 mg  25 mg Oral Daily Izediuno, Delight Ovens, MD   25 mg at 10/24/17 1028   PTA Medications: No medications prior to admission.    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   Physician Treatment Plan for Primary Diagnosis: Substance induced mood disorder (HCC) Long Term Goal(s): Improvement in symptoms so as ready for discharge Improvement in symptoms so as ready for discharge   Short Term Goals: Ability to identify changes in lifestyle to reduce recurrence of condition will improve Ability to verbalize feelings will improve Ability to disclose and discuss suicidal ideas Ability to demonstrate self-control will improve Ability to demonstrate self-control will improve Ability to identify and develop effective coping behaviors will improve Ability to identify triggers  associated with substance abuse/mental health issues will improve  Medication Management: Evaluate patient's response, side effects, and tolerance of medication regimen.  Therapeutic Interventions: 1 to 1 sessions, Unit Group sessions and Medication administration.  Evaluation of Outcomes: Progressing  Physician Treatment Plan for Secondary Diagnosis: Principal Problem:   Substance induced mood disorder (HCC) Active Problems:   Severe recurrent major depression without psychotic features (HCC)  Long Term Goal(s): Improvement in symptoms so as ready for discharge Improvement  in symptoms so as ready for discharge   Short Term Goals: Ability to identify changes in lifestyle to reduce recurrence of condition will improve Ability to verbalize feelings will improve Ability to disclose and discuss suicidal ideas Ability to demonstrate self-control will improve Ability to demonstrate self-control will improve Ability to identify and develop effective coping behaviors will improve Ability to identify triggers associated with substance abuse/mental health issues will improve     Medication Management: Evaluate patient's response, side effects, and tolerance of medication regimen.  Therapeutic Interventions: 1 to 1 sessions, Unit Group sessions and Medication administration.  Evaluation of Outcomes: Progressing   RN Treatment Plan for Primary Diagnosis: Substance induced mood disorder (HCC) Long Term Goal(s): Knowledge of disease and therapeutic regimen to maintain health will improve  Short Term Goals: Ability to remain free from injury will improve, Ability to disclose and discuss suicidal ideas and Ability to identify and develop effective coping behaviors will improve  Medication Management: RN will administer medications as ordered by provider, will assess and evaluate patient's response and provide education to patient for prescribed medication. RN will report any adverse and/or side effects to prescribing provider.  Therapeutic Interventions: 1 on 1 counseling sessions, Psychoeducation, Medication administration, Evaluate responses to treatment, Monitor vital signs and CBGs as ordered, Perform/monitor CIWA, COWS, AIMS and Fall Risk screenings as ordered, Perform wound care treatments as ordered.  Evaluation of Outcomes: Progressing   LCSW Treatment Plan for Primary Diagnosis: Substance induced mood disorder (HCC) Long Term Goal(s): Safe transition to appropriate next level of care at discharge, Engage patient in therapeutic group addressing interpersonal  concerns.  Short Term Goals: Engage patient in aftercare planning with referrals and resources, Facilitate patient progression through stages of change regarding substance use diagnoses and concerns and Identify triggers associated with mental health/substance abuse issues  Therapeutic Interventions: Assess for all discharge needs, 1 to 1 time with Social worker, Explore available resources and support systems, Assess for adequacy in community support network, Educate family and significant other(s) on suicide prevention, Complete Psychosocial Assessment, Interpersonal group therapy.  Evaluation of Outcomes: Progressing   Progress in Treatment: Attending groups: Yes. Participating in groups: Yes. Taking medication as prescribed: Yes. Toleration medication: Yes. Family/Significant other contact made: No, will contact:  pt's mother Patient understands diagnosis: Yes. Discussing patient identified problems/goals with staff: Yes. Medical problems stabilized or resolved: Yes. Denies suicidal/homicidal ideation: Yes. Issues/concerns per patient self-inv/entory: No. Other: n/a   New problem(s) identified: No, Describe:  n/a  New Short Term/Long Term Goal(s): detox, medication management for mood stabilization; elimination of SI thoughts; development of comprehensive mental wellness/sobriety plan.   Discharge Plan or Barriers: CSW assessing for appropriate referrals. Pt is declining rehab and is interested in outpatient mental health/substance abuse options. MHAG pamphlet and AA/NA list for [facility] provided to pt for additional community supports.   Reason for Continuation of Hospitalization: Anxiety Depression Medication stabilization Suicidal ideation Withdrawal symptoms  Estimated Length of Stay: Wed, 10/26/17  Attendees: Patient: 10/24/2017 10:57 AM  Physician: Dr. Altamese Balm[place]  MD; Dr. Jackquline BerlinIzediuno MD 10/24/2017 10:57 AM  Nursing: Earl LagosPenny, Christa RN 10/24/2017 10:57 AM  RN Care Manager:x  10/24/2017 10:57 AM  Social Worker: Trula SladeHeather Smart, LCSW 10/24/2017 10:57 AM  Recreational Therapist: x 10/24/2017 10:57 AM  Other: Hillery Jacksanika Lewis NP; Feliz Beamravis Money NP 10/24/2017 10:57 AM  Other:  10/24/2017 10:57 AM  Other: 10/24/2017 10:57 AM    Scribe for Treatment Team: Ledell PeoplesHeather N Smart, LCSW 10/24/2017 10:57 AM

## 2017-10-24 NOTE — Progress Notes (Signed)
Recreation Therapy Notes  Date: 10/24/17 Time: 0930 Location: [address] Dayroom  Group Topic: Stress Management  Goal Area(s) Addresses:  Patient will verbalize importance of using healthy stress management.  Patient will identify positive emotions associated with healthy stress management.   Intervention: Stress Management  Activity :  LRT introduced the stress management technique of meditation.  LRT played a meditation to help patients deal with anxiety.  Patients were to follow along to fully engage in the meditation.  Education:  Stress Management, Discharge Planning.   Education Outcome: Acknowledges edcuation/In group clarification offered/Needs additional education  Clinical Observations/Feedback: Pt did not attend group.    Estel Scholze, LRT/CTRS         Leon Montoya A 10/24/2017 11:18 AM 

## 2017-10-24 NOTE — [facility] Group Notes (Signed)
[facility] 10/24/2017 1:15pm  Type of Therapy: Mental Health Association Presentation  Participation Level: Active  Participation Quality: Attentive  Affect: Appropriate  Cognitive: Oriented  Insight: Developing/Improving  Engagement in Therapy: Engaged  Modes of Intervention: Discussion, Education and Socialization  Summary of Progress/Problems: Mental Health Association (MHA) Speaker came to talk about his personal journey with mental health. The pt processed ways by which to relate to the speaker. MHA speaker provided handouts and educational information pertaining to groups and services offered by the [facility]. Pt was engaged in speaker's presentation and was receptive to resources provided.    Pulte HomesHeather N Smart, LCSW 10/24/2017 2:20 PM

## 2017-10-24 NOTE — Progress Notes (Signed)
Data. Patient denies SI/HI/AVH. Verbally contracts for safety on the unit and to come to staff before acting of any self harm thoughts/feelings.  Patient spent most of the morning in her room in bed, and refused to get up for AM meds until late. When she did get up she was irritable and demanding. She complained of detox symptoms continuing and being severe. Especially diarrhea, nausea and "jitters".  Affect is irritable and her conversation is short and demanding. Patient refused her self assessment this shift. Patient does want to leave tomorrow. Action. Emotional support and encouragement offered. Education provided on medication, indications and side effect. Q 15 minute checks done for safety. Response. Safety on the unit maintained through 15 minute checks.  Medications taken as prescribed. Attended groups.

## 2017-10-24 NOTE — Plan of Care (Signed)
  Progressing Medication: Compliance with prescribed medication regimen will improve 10/24/2017 2144 - Progressing by Arrie Aranhurch, Prateek Knipple J, RN Note Pt has been compliant with HS medications tonight.

## 2017-10-24 NOTE — Progress Notes (Signed)
D: Pt was in bed in her room upon initial approach.  Pt presents with irritable, anxious affect and mood.  Her goal is to "get better."  She states "I think I'm leaving tomorrow" and reports she feels safe to discharge.  She reports she plans to go to AA and NA.  She repeatedly expressed that she hopes to discharge tomorrow and reports being irritable because "people keep waking me up every 15 minutes knocking on my door, I can't even have my cell phone."  Pt denies SI/HI, denies hallucinations, reports generalized pain of 6/10.  Pt has isolated to her room for the majority of the night and she did not attend evening group.  She initially refused to allow MHT to obtain vital signs but agreed to have vitals taken when writer explained the importance of monitoring her vitals while she is taking some of the medications she is prescribed and withdrawing.  Pt verbalized understanding.    A: Introduced self to pt.  Actively listened to pt and offered support and encouragement. Medications administered per order.  PRN medication administered for anxiety and pain.  Q15 minute safety checks maintained.    R: Pt is safe on the unit.  Pt is compliant with medications.  Pt verbally contracts for safety.  Will continue to monitor and assess.

## 2017-10-25 MED ORDER — NICOTINE 21 MG/24HR TD PT24: 21.0000 mg | MEDICATED_PATCH | Freq: Every day | TRANSDERMAL | 0 refills | Status: DC

## 2017-10-25 MED ORDER — HYDROXYZINE HCL 25 MG PO TABS: 25.0000 mg | ORAL_TABLET | Freq: Four times a day (QID) | ORAL | 0 refills | Status: DC | PRN

## 2017-10-25 MED ORDER — LORAZEPAM 1 MG PO TABS
ORAL_TABLET | ORAL | Status: AC
  Filled 2017-10-25: qty 1

## 2017-10-25 MED ORDER — LIDOCAINE 5 % EX PTCH: 1.0000 | MEDICATED_PATCH | CUTANEOUS | 0 refills | Status: DC

## 2017-10-25 MED ORDER — QUETIAPINE FUMARATE 25 MG PO TABS: 25.0000 mg | ORAL_TABLET | Freq: Every day | ORAL | 0 refills | Status: DC

## 2017-10-25 MED ORDER — LORAZEPAM 1 MG PO TABS
1.0000 mg | ORAL_TABLET | Freq: Once | ORAL | Status: AC
  Administered 2017-10-25: 1 mg via ORAL

## 2017-10-25 MED ORDER — QUETIAPINE FUMARATE 100 MG PO TABS: 100.0000 mg | ORAL_TABLET | Freq: Every day | ORAL | 0 refills | Status: DC

## 2017-10-25 NOTE — Progress Notes (Signed)
Pt is very agitated this morning, screaming profanities in her room.  MHT went to get her vital signs and she refused.  Writer went to attempt to de-escalate pt and she screams "I need to get the fuck out of here today.  Call the damn police.  This place is jail, prison.  It's so nasty in here.  Why do I have to talk to the fucking doctor to leave?"  Pt was throwing away her trash (drinks, food) in her room as she was saying this.  On-site provider notified of pt's agitation and Ativan 1 mg POX1 was ordered.  Writer went to pt's room to administer medication.  The door to her room was approximately halfway open so writer opened door the rest of the way and pt was sitting on her bed without a top on.  She stated "Oh God.  I'm sorry, I need another top, will you get me one please?"  Writer did not enter her room past her doorway.  Pt given another scrub top by female MHT.  Pt came to medication window and took Ativan 1 mg POX1.  Pt apologized to Clinical research associatewriter and then began talking about why she "needs" to leave today.  Pt reports she has an appointment today at 1600 and a dentist appointment tomorrow to have a cavity filled.  Positive coping skills were encouraged.  Pt made a phone call and then returned to her room.

## 2017-10-25 NOTE — Discharge Summary (Signed)
Physician Discharge Summary Note  Patient:  Mia [name] is an 42 y.o., female MRN:  [id number] DOB:  [birth year] Patient phone:  [phone] (home)  Patient address:   [address] [place] [facility] [place] [address],  Total Time spent with patient: Greater than 30 minutes  Date of Admission:  10/22/2017  Date of Discharge: 10-25-17  Reason for Admission: Drug overdose.  Principal Problem: Substance induced mood disorder [facility])  Discharge Diagnoses: Patient Active Problem List   Diagnosis Date Noted  . Severe recurrent major depression without psychotic features (HCC) [F33.2] 10/22/2017  . Periorbital cellulitis of right eye [L03.213] 09/26/2017  . Periorbital cellulitis [L03.213] 09/26/2017  . Suicidal ideation [R45.851] 03/01/2016  . UTI (urinary tract infection) [N39.0] 03/01/2016  . Bipolar 1 disorder, mixed, moderate (HCC) [F31.62]   . Opioid use disorder, moderate, dependence (HCC) [F11.20] 02/28/2016  . Sedative, hypnotic or anxiolytic use disorder, severe, dependence (HCC) [F13.20] 02/28/2016  . Substance induced mood disorder [facility]) [F19.94] 02/28/2016   Past Psychiatric History: Bipolar 1 disorder, Opioid use disorder.  Past Medical History:  Past Medical History:  Diagnosis Date  . Anxiety   . Bipolar 1 disorder (HCC)   . Cystic acne     Past Surgical History:  Procedure Laterality Date  . CESAREAN SECTION     Family History: History reviewed. No pertinent family history.  Family Psychiatric  History: See H&P  Social History:  Social History   Substance and Sexual Activity  Alcohol Use No     Social History   Substance and Sexual Activity  Drug Use No    Social History   Socioeconomic History  . Marital status: Single    Spouse name: None  . Number of children: None  . Years of education: None  . Highest education level: None  Social Needs  . Financial resource strain: None  . Food insecurity - worry: None  . Food insecurity - inability: None   . Transportation needs - medical: None  . Transportation needs - non-medical: None  Occupational History  . None  Tobacco Use  . Smoking status: Current Some Day Smoker    Packs/day: 1.00    Types: Cigarettes  . Smokeless tobacco: Never Used  Substance and Sexual Activity  . Alcohol use: No  . Drug use: No  . Sexual activity: None  Other Topics Concern  . None  Social History Narrative  . None   Hospital Course: (Per admission assessment):  42 y.o Caucasian female, unemployed. Background history of Bipolar disorder, SUD. Presented to the ER after an OD. She took $00 worth of heroine and Flexaril and passed out. Patient was at her mother's place when she did this. Her mother called for help. She was resuscitated with Narcan. UDS positive for opiates. Patient reports OD was accidental. Says she never had any intention to end her life. She has past history of accidental OD. She is still slightly drowsy.  Feels awful and irritated inside, reports aches an pains. No nausea , retching or vomiting. No diarrhea. No goose flesh, rhinorrhea. No tactile, visual or auditory hallucination. No suicidal thoughts. No homicidal thoughts.   After the above admission assessment, [name] was started on the clonidine detoxification treatment protocols to re-stabilize her systems of Opioid intoxications. She was also medicated & discharged on; Hydroxyzine 25 mg prn for anxiety, Nicotine patch 21 mg for smoking cessation, Seroquel 100 mg for mood control & Seroquel 25 mg for daytime agitation. She was enrolled in group the counseling sessions  being offered & held on this unit to learn coping skills that should help her after discharge to cope better and manage her substance abuse issues to maintain sobriety/mood stability. She presented other significant health issues that required treatments while hospitalized here. She received treatment for those health issues. Her most recent lab results revealed a positive  hepatitis C infection. She is referred to the infections disease clinic on [address] [place] for further evaluation & possible treatment as noted below.    Mia [name] has successfully completed the detoxifcation treatments. Her mood is also stable. This is evidenced by her presentation of good affects & absence of substance withdrawal symptoms. She is currently being discharged to continue routine psychiatric care, substance abuse treatment & medication management on an outpatient basis as noted below. She is provided with all the necessary information needed to make this appointments without problems.   Upon discharge, Mia [name] adamantly denies any suicidal, homicidal ideations,delusional thought, auditory, visual hallucinations and or substance withdrawal symptoms.She left [facility] in no apparent distress with all personal belongings. Transportation per her friend.  Physical Findings: AIMS: Facial and Oral Movements Muscles of Facial Expression: None, normal Lips and Perioral Area: None, normal Jaw: None, normal Tongue: None, normal,Extremity Movements Upper (arms, wrists, hands, fingers): None, normal Lower (legs, knees, ankles, toes): None, normal, Trunk Movements Neck, shoulders, hips: None, normal, Overall Severity Severity of abnormal movements (highest score from questions above): None, normal Incapacitation due to abnormal movements: None, normal Patient's awareness of abnormal movements (rate only patient's report): No Awareness, Dental Status Current problems with teeth and/or dentures?: No Does patient usually wear dentures?: No  CIWA:  CIWA-Ar Total: 10 COWS:  COWS Total Score: 6  Musculoskeletal: Strength & Muscle Tone: within normal limits Gait & Station: normal Patient leans: N/A  Psychiatric Specialty Exam: Physical Exam  Constitutional: She appears well-developed.  HENT:  Head: Normocephalic.  Eyes: Pupils are equal, round, and reactive to light.  Neck:  Normal range of motion.  Cardiovascular: Normal rate.  Respiratory: Effort normal.  GI: Soft.  Genitourinary:  Genitourinary Comments: Deferred  Musculoskeletal: Normal range of motion.  Neurological: She is alert.  Skin: Skin is warm.    Review of Systems  Constitutional: Negative.   HENT: Negative.   Eyes: Negative.   Respiratory: Negative.   Cardiovascular: Negative.   Gastrointestinal: Negative.   Genitourinary: Negative.   Musculoskeletal: Negative.   Skin: Negative.   Neurological: Negative.   Endo/Heme/Allergies: Negative.   Psychiatric/Behavioral: Positive for depression (Stable) and substance abuse. Negative for hallucinations, memory loss and suicidal ideas (Opioid use disorder). The patient has insomnia (Stable). The patient is not nervous/anxious.     Blood pressure 119/77, pulse 80, temperature 98.2 F (36.8 C), temperature source Oral, resp. rate 18, height 5\' 4"  (1.626 m), weight 83.9 kg (185 lb), SpO2 100 %.Body mass index is 31.76 kg/m.  See Md's SRA   Have you used any form of tobacco in the last 30 days? (Cigarettes, Smokeless Tobacco, Cigars, and/or Pipes): Yes  Has this patient used any form of tobacco in the last 30 days? (Cigarettes, Smokeless Tobacco, Cigars, and/or Pipes): Yes, an FDA-approved tobacco cessation medication was offered at discharge.  Blood Alcohol level:  Lab Results  Component Value Date   [facility] 10/21/2017   ETH <5 02/27/2016   Metabolic Disorder Labs:  Lab Results  Component Value Date   HGBA1C 5.2 10/23/2017   MPG 102.54 10/23/2017   Lab Results  Component Value Date  PROLACTIN 8.5 10/23/2017   PROLACTIN 151.1 (H) 03/03/2016   Lab Results  Component Value Date   CHOL 168 10/23/2017   TRIG 92 10/23/2017   HDL 61 10/23/2017   CHOLHDL 2.8 10/23/2017   VLDL 18 10/23/2017   LDLCALC 89 10/23/2017   LDLCALC 176 (H) 03/03/2016    See Psychiatric Specialty Exam and Suicide Risk Assessment completed by Attending Physician  prior to discharge.  Discharge destination:  Home  Is patient on multiple antipsychotic therapies at discharge:  No   Has Patient had three or more failed trials of antipsychotic monotherapy by history:  No  Recommended Plan for Multiple Antipsychotic Therapies: NA  Allergies as of 10/25/2017   No Known Allergies     Medication List    TAKE these medications     Indication  hydrOXYzine 25 MG tablet Commonly known as:  ATARAX/VISTARIL Take 1 tablet (25 mg total) by mouth every 6 (six) hours as needed for anxiety.  Indication:  Feeling Anxious   lidocaine 5 % Commonly known as:  LIDODERM Place 1 patch onto the skin daily. Remove & Discard patch within 12 hours or as directed by MD: For pain  Indication:  Pain management   nicotine 21 mg/24hr patch Commonly known as:  NICODERM CQ - dosed in mg/24 hours Place 1 patch (21 mg total) onto the skin daily. (May purchase from over the counter at your local pharmacy): For smoking cessation. Start taking on:  10/26/2017  Indication:  Nicotine Addiction   QUEtiapine 100 MG tablet Commonly known as:  SEROQUEL Take 1 tablet (100 mg total) by mouth at bedtime. For mood control  Indication:  Mood control   QUEtiapine 25 MG tablet Commonly known as:  SEROQUEL Take 1 tablet (25 mg total) by mouth daily. For agitation Start taking on:  10/26/2017  Indication:  Mood control      Follow-up Information     [facility] Follow up.   Why:  Follow-up with office to schedule appt.  Contact information: [address] [place] [address]       [facility] Follow up on 10/28/2017.   Specialty:  Behavioral Health Why:  Hospital follow-up on Friday 1/11 at 8:00AM. Please bring: photo ID, Social security card, and hospital follow-up paperwork. Thank you.  Contact information: [address] [place] [address] [phone]        [facility]             . Go on 10/27/2017.   Why:  You  tested positive for Hepatitis C. P/s you are hereby instructed to go to the infectious disease clinic listed above on the date shown for evaluation/treatment for Hepatitis C. Contact information: [address] [place] [address]         Follow-up recommendations: Activity:  As tolerated Diet: As recommended by your primary care doctor. Keep all scheduled follow-up appointments as recommended.   Comments: Patient is instructed prior to discharge to: Take all medications as prescribed by his/her mental healthcare provider. Report any adverse effects and or reactions from the medicines to his/her outpatient provider promptly. Patient has been instructed & cautioned: To not engage in alcohol and or illegal drug use while on prescription medicines. In the event of worsening symptoms, patient is instructed to call the crisis hotline, 911 and or go to the nearest ED for appropriate evaluation and treatment of symptoms. To follow-up with his/her primary care provider for your other  medical issues, concerns and or health care needs.   Signed: Armandina Stammer, NP, PMHNP, FNP-BC 10/25/2017, 10:23 AM    Patient seen, Suicide Assessment Completed.  Disposition Plan Reviewed   Jashawna Reever is a 42 y/o F with history of bipolar disorder and opioid use disorder who was admitted with worsening depression and unintentional overdose of flexeril and heroin. Pt was restarted on seroquel and placed on opioid withdrawal protocol with clonidine. She has been observed on the inpatient psychiatry unit and she has been reporting improvement of her presenting symptoms of depression.  Today upon evaluation, pt is bright and pleasant. She reports she is doing "great." She denies SI/HI/AH/VH. She is sleeping well and her appetite is good. She is future oriented about follow up with her PCP regarding treatment of cellulitis on her face, and she is also in agreement to have follow up at  [facility] for her mental health symptoms. Discussed with patient about her readiness to cut back on illicit substance use, and she explained, "I ready to cut back because I can see how it's affecting my life." Pt states she will arrange for follow up on her own through [place] and attend AA/NA meetings. She was able to engage in safety planning including plan to return to [facility] or contact emergency services if she feels unable to maintain her own safety. Pt had no further questions, comments, or concerns.   Plan Of Care/Follow-up recommendations:   - Discharge to outpatient level of care  -Bipolar disorder - Continue Seroquel 25 mg PO BID and 100 mg QHS for moodstabilization  -anxiety                         - Continue atarax 25mg  po q8h prn anxiety  -Isomnia - Continue with Trazodone50 mgfor insomnia   Activity:  as tolerated Diet:  normal Tests:  NA Other:  see above for DC plan  Micheal Likens, MD

## 2017-10-25 NOTE — Progress Notes (Signed)
72 hour AMA discharge request process was explained to pt and she verbalized understanding.  Pt signed AMA discharge request form.

## 2017-10-25 NOTE — Progress Notes (Signed)
Pt d/c from the hospital with her mother. All items returned. D/C instructions given and prescriptions given. Pt denies si and hi.  

## 2017-10-25 NOTE — Progress Notes (Signed)
  [facility] Discharge Plan :  Will you be returning to the same living situation after discharge:  Yes,  home At discharge, do you have transportation home?: Yes,  family/friend Do you have the ability to pay for your medications: Yes,  [facility]  Release of information consent forms completed and submitted to medical records by CSW.  Patient to Follow up at: Follow-up Information    [facility] Follow up.   Why:  Follow-up with office to schedule appt.  Contact information: [address] [place] [phone]       [facility] Follow up on 10/28/2017.   Specialty:  Behavioral Health Why:  Hospital follow-up on Friday 1/11 at 8:00AM. Please bring: photo ID, Social security card, and hospital follow-up paperwork. Thank you.  Contact information: [address] [place] [id number] [phone]           Next level of care provider has access to [facility] Link:no  Safety Planning and Suicide Prevention discussed: Yes,  SPE completed with pt; pt declined to consent to family contact. SPI pamphlet and mobile crisis information provided to pt.   Have you used any form of tobacco in the last 30 days? (Cigarettes, Smokeless Tobacco, Cigars, and/or Pipes): Yes  Has patient been referred to the Quitline?: Patient refused referral  Patient has been referred for addiction treatment: Yes  Pulte HomesHeather N Smart, LCSW 10/25/2017, 9:14 AM

## 2017-10-25 NOTE — [facility] Suicide Risk Assessment (Signed)
[facility] INPATIENT:  Family/Significant Other Suicide Prevention Education  Suicide Prevention Education:  Patient Refusal for Family/Significant Other Suicide Prevention Education: The patient Mia [name] has refused to provide written consent for family/significant other to be provided Family/Significant Other Suicide Prevention Education during admission and/or prior to discharge.  Physician notified.  SPE completed with pt, as pt refused to consent to family contact. SPI pamphlet provided to pt and pt was encouraged to share information with support network, ask questions, and talk about any concerns relating to SPE. Pt denies access to guns/firearms and verbalized understanding of information provided. Mobile Crisis information also provided to pt.   Elizebeth Kluesner N Smart LCSW 10/25/2017, 9:14 AM

## 2017-10-25 NOTE — [facility] Suicide Risk Assessment (Signed)
[facility] Discharge Suicide Risk Assessment   Principal Problem: Substance induced mood disorder [facility]) Discharge Diagnoses:  Patient Active Problem List   Diagnosis Date Noted  . Severe recurrent major depression without psychotic features (HCC) [F33.2] 10/22/2017  . Periorbital cellulitis of right eye [L03.213] 09/26/2017  . Periorbital cellulitis [L03.213] 09/26/2017  . Suicidal ideation [R45.851] 03/01/2016  . UTI (urinary tract infection) [N39.0] 03/01/2016  . Bipolar 1 disorder, mixed, moderate (HCC) [F31.62]   . Opioid use disorder, moderate, dependence (HCC) [F11.20] 02/28/2016  . Sedative, hypnotic or anxiolytic use disorder, severe, dependence (HCC) [F13.20] 02/28/2016  . Substance induced mood disorder (HCC) [F19.94] 02/28/2016    Total Time spent with patient: 30 minutes  Musculoskeletal: Strength & Muscle Tone: within normal limits Gait & Station: normal Patient leans: N/A  Psychiatric Specialty Exam: Review of Systems  Constitutional: Negative for chills and fever.  Respiratory: Negative for cough.   Gastrointestinal: Negative for heartburn and nausea.  Psychiatric/Behavioral: Negative for depression, hallucinations and suicidal ideas. The patient is not nervous/anxious.     Blood pressure 119/77, pulse 80, temperature 98.2 F (36.8 C), temperature source Oral, resp. rate 18, height 5\' 4"  (1.626 m), weight 83.9 kg (185 lb), SpO2 100 %.Body mass index is 31.76 kg/m.  General Appearance: Casual and Fairly Groomed  Patent attorney::  Good  Speech:  Clear and Coherent and Normal Rate  Volume:  Normal  Mood:  Euthymic  Affect:  Appropriate, Congruent and Full Range  Thought Process:  Coherent and Goal Directed  Orientation:  Full (Time, Place, and Person)  Thought Content:  Logical  Suicidal Thoughts:  No  Homicidal Thoughts:  No  Memory:  Immediate;   Good Recent;   Good Remote;   Good  Judgement:  Fair  Insight:  Fair  Psychomotor Activity:  Normal  Concentration:   Fair  Recall:  [employer] of Knowledge:Fair  Language: Fair  Akathisia:  No  Handed:    AIMS (if indicated):     Assets:  Communication Skills Resilience Social Support Talents/Skills  Sleep:  Number of Hours: 6.25  Cognition: WNL  ADL's:  Intact   Mental Status Per Nursing Assessment::   On Admission:     Demographic Factors:  Low socioeconomic status  Loss Factors: Financial problems/change in socioeconomic status  Historical Factors: Impulsivity  Risk Reduction Factors:   Positive social support, Positive therapeutic relationship and Positive coping skills or problem solving skills  Continued Clinical Symptoms:  Alcohol/Substance Abuse/Dependencies  Cognitive Features That Contribute To Risk:  None    Suicide Risk:  Minimal: No identifiable suicidal ideation.  Patients presenting with no risk factors but with morbid ruminations; may be classified as minimal risk based on the severity of the depressive symptoms  Follow-up Information    [facility] Follow up.   Why:  Follow-up with office to schedule appt.  Contact information: [address] [place] [address]       [facility] Follow up on 10/28/2017.   Specialty:  Behavioral Health Why:  Hospital follow-up on Friday 1/11 at 8:00AM. Please bring: photo ID, Social security card, and hospital follow-up paperwork. Thank you.  Contact information: [address] [place] [address] [phone]         Subjective Data: Mia [name] is a 43 y/o F with history of bipolar disorder and opioid use disorder who was admitted with worsening depression and unintentional overdose of flexeril and heroin. Pt was restarted on seroquel and placed on opioid withdrawal protocol  with clonidine. She has been observed on the inpatient psychiatry unit and she has been reporting improvement of her presenting symptoms of depression.  Today upon evaluation, pt is bright and pleasant. She  reports she is doing "great." She denies SI/HI/AH/VH. She is sleeping well and her appetite is good. She is future oriented about follow up with her PCP regarding treatment of cellulitis on her face, and she is also in agreement to [facility]. Discussed with patient about her readiness to cut back on illicit substance use, and she explained, "I ready to cut back because I can see how it's affecting my life." Pt states she will arrange for follow up on her own [facility] and attend AA/NA meetings. She was able to engage in safety planning including [facility] or contact emergency services if she feels unable to maintain her own safety. Pt had no further questions, comments, or concerns.   Plan Of Care/Follow-up recommendations:   - Discharge to outpatient level of care  -Bipolar disorder             - Continue Seroquel 25 mg PO BID and 100 mg QHS for moodstabilization  -anxiety    - Continue atarax 25mg  po q8h prn anxiety  -Isomnia                         - Continue with Trazodone50 mgfor insomnia   Activity:  as tolerated Diet:  normal Tests:  NA Other:  see above for DC plan  Micheal Likenshristopher T Margret Moat, MD 10/25/2017, 10:18 AM

## 2017-11-07 DIAGNOSIS — I440 Atrioventricular block, first degree: Secondary | ICD-10-CM

## 2017-11-07 HISTORY — DX: Atrioventricular block, first degree: I44.0

## 2017-11-07 NOTE — Progress Notes (Signed)
Cardiology Office Note:    Date:  11/08/2017   ID:  Mia Comptonabitha A Gladd, DOB 11/07/1975, MRN [id number]  PCP:  Patient, No Pcp Per  Cardiologist:  Norman HerrlichBrian Munley, MD   Referring MD: No ref. provider found  ASSESSMENT:    1. First degree heart block   2. Palpitation    PLAN:    In order of problems listed above:  1. For further evaluation view of her high risk status taking multiple drugs with the effect to potentially prolong QT interval and symptoms of palpitation I asked to utilize a 48-hour Holter monitor echocardiogram to screen for underlying heart disease.  I stressed with patient and her mother that she is still every provider off medication she can take and that any medications need to be screened with her psychiatrist.  When first seen by me she did not tell if she was taking promethazine or methadone.  She relates a vague history of heart murmur 20 years ago no heart disease no murmur on exam repeat echocardiogram has been requested.  Next appointment   Medication Adjustments/Labs and Tests Ordered: Current medicines are reviewed at length with the patient today.  Concerns regarding medicines are outlined above.  Orders Placed This Encounter  Procedures  . Holter monitor - 48 hour  . EKG 12-Lead  . ECHOCARDIOGRAM COMPLETE   No orders of the defined types were placed in this encounter.    Chief Complaint  Patient presents with  . New Patient (Initial Visit)    [facility]  . Abnormal ECG    History of Present Illness:    Mia [name] is a 42 y.o. female who is being seen today for the evaluation of abnormal EKG marked first degree AV block PR 287 msecs at the request of the discharge physician at behavioral health.  Unfortunately those records cannot be reviewed by me.  During the hospitalization because of first-degree heart block she tells me she [facility] and was  subsequently sent back to behavioral health.  She does have palpitation which he feels under stress no chest pain syncope or TIA 20 years ago she was told she had mitral valve prolapse had an echocardiogram performed she is unaware of the results.  Initially she did not tell me she was taking promethazine or methadone until I questioned her about medications in particular any with the potential to prolong QT interval.  There is no family history of channel neuropathy.   Past Medical History:  Diagnosis Date  . Alcohol use disorder, mild, in sustained remission 04/05/2017  . Anxiety   . Anxiety state 08/10/2013   Overview:  Updated invalid ICD 9 codes for ICD 10 go live  . Bipolar 1 disorder (HCC)   . Bipolar 1 disorder, mixed, moderate (HCC)   . Cocaine use disorder, mild, abuse (HCC) 04/05/2017  . Cystic acne   . First degree heart block 11/07/2017  . 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 Moderate opioid use disorder (HCC) 04/05/2017  . Opioid use disorder, moderate, dependence (HCC) 02/28/2016  . Periorbital cellulitis 09/26/2017  . Periorbital cellulitis of right eye 09/26/2017  . 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 Severe recurrent major depression without psychotic features (HCC) 10/22/2017  . Substance induced mood disorder (HCC) 02/28/2016  . Suicidal ideation 03/01/2016  . Tobacco use disorder 04/05/2017  . UTI (urinary tract  infection) 03/01/2016    Past Surgical History:  Procedure Laterality Date  . CESAREAN SECTION      Current Medications: Current Meds  Medication Sig  . hydrOXYzine (ATARAX/VISTARIL) 25 MG tablet Take 1 tablet (25 mg total) by mouth every 6 (six) hours as needed for anxiety.  . methadone (METHADOSE) 40 MG disintegrating tablet Take 40 mg by mouth daily.  . QUEtiapine (SEROQUEL) 100 MG tablet Take 1 tablet (100 mg total) by mouth at bedtime. For mood control  . QUEtiapine (SEROQUEL) 25 MG  tablet Take 1 tablet (25 mg total) by mouth daily. For agitation     Allergies:   Patient has no known allergies.   Social History   Socioeconomic History  . Marital status: Single    Spouse name: None  . Number of children: None  . Years of education: None  . Highest education level: None  Social Needs  . Financial resource strain: None  . Food insecurity - worry: None  . Food insecurity - inability: None  . Transportation needs - medical: None  . Transportation needs - non-medical: None  Occupational History  . None  Tobacco Use  . Smoking status: Current Some Day Smoker    Packs/day: 1.00    Types: Cigarettes  . Smokeless tobacco: Never Used  Substance and Sexual Activity  . Alcohol use: No  . Drug use: No  . Sexual activity: None  Other Topics Concern  . None  Social History Narrative  . None     Family History:  ROS:   Review of Systems  Constitution: Positive for malaise/fatigue.  HENT: Negative.   Eyes: Negative.   Cardiovascular: Positive for palpitations.  Respiratory: Negative.   Endocrine: Negative.   Hematologic/Lymphatic: Negative.   Skin: Negative.   Musculoskeletal: Positive for back pain.  Gastrointestinal: Positive for constipation.  Genitourinary: Negative.   Neurological: Negative.   Psychiatric/Behavioral: The patient is nervous/anxious.   Allergic/Immunologic: Negative.    Please see the history of present illness.     All other systems reviewed and are negative.  EKGs/Labs/Other Studies Reviewed:    The following studies were reviewed today:   EKG:  EKG is  ordered today.  The ekg ordered today demonstrates first-degree heart block otherwise normal normal QT interval and T waves  Recent Labs: 09/30/2017: Magnesium 2.0 10/21/2017: ALT 38; Platelets 279 10/22/2017: BUN 22; Creatinine, Ser 0.90; Hemoglobin 12.9; Potassium 4.2; Sodium 137 10/23/2017: TSH 1.146  Recent Lipid Panel    Component Value Date/Time   CHOL 168 10/23/2017  0648   TRIG 92 10/23/2017 0648   HDL 61 10/23/2017 0648   CHOLHDL 2.8 10/23/2017 0648   VLDL 18 10/23/2017 0648   LDLCALC 89 10/23/2017 0648    Physical Exam:    VS:  BP (!) 144/92 (BP Location: Right Arm, Patient Position: Sitting, Cuff Size: Small)   Pulse 67   Ht 5\' 4"  (1.626 m)   Wt 193 lb 6.4 oz (87.7 kg)   SpO2 98%   BMI 33.20 kg/m     Wt Readings from Last 3 Encounters:  11/08/17 193 lb 6.4 oz (87.7 kg)  09/26/17 165 lb (74.8 kg)  07/24/16 191 lb 1.6 oz (86.7 kg)     GEN: Very anxious and apprehensive well nourished, well developed in no acute distress HEENT: Normal NECK: No JVD; No carotid bruits LYMPHATICS: No lymphadenopathy CARDIAC: RRR, no murmurs, rubs, gallops RESPIRATORY:  Clear to auscultation without rales, wheezing or rhonchi  ABDOMEN: Soft, non-tender, non-distended MUSCULOSKELETAL:  No edema; No deformity  SKIN: Warm and dry NEUROLOGIC:  Alert and oriented x 3 PSYCHIATRIC:  Normal affect     Signed, Norman Herrlich, MD  11/08/2017 1:10 PM    [facility]

## 2017-11-08 ENCOUNTER — Ambulatory Visit: Payer: Medicaid Other | Admitting: Cardiology

## 2017-11-08 ENCOUNTER — Encounter: Payer: Self-pay | Admitting: Cardiology

## 2017-11-08 VITALS — BP 144/92 | HR 67 | Ht 64.0 in | Wt 193.4 lb

## 2017-11-08 DIAGNOSIS — R002 Palpitations: Secondary | ICD-10-CM

## 2017-11-08 DIAGNOSIS — I440 Atrioventricular block, first degree: Secondary | ICD-10-CM | POA: Diagnosis not present

## 2017-11-08 NOTE — Patient Instructions (Addendum)
Medication Instructions:  Your physician has recommended you make the following change in your medication:  STOP promethazine  Labwork: None  Testing/Procedures: You had an EKG today.   Your physician has requested that you have an echocardiogram. Echocardiography is a painless test that uses sound waves to create images of your heart. It provides your doctor with information about the size and shape of your heart and how well your heart's chambers and valves are working. This procedure takes approximately one hour. There are no restrictions for this procedure.  Your physician has recommended that you wear a holter monitor. Holter monitors are medical devices that record the heart's electrical activity. Doctors most often use these monitors to diagnose arrhythmias. Arrhythmias are problems with the speed or rhythm of the heartbeat. The monitor is a small, portable device. You can wear one while you do your normal daily activities. This is usually used to diagnose what is causing palpitations/syncope (passing out). 48 hour.  Follow-Up: Your physician recommends that you schedule a follow-up appointment in: 1 month.  Any Other Special Instructions Will Be Listed Below (If Applicable).     If you need a refill on your cardiac medications before your next appointment, please call your pharmacy.    1. Avoid all over-the-counter antihistamines except Claritin/Loratadine and Zyrtec/Cetrizine. 2. Avoid all combination including cold sinus allergies flu decongestant and sleep medications 3. You can use Robitussin DM Mucinex and Mucinex DM for cough. 4. can use Tylenol aspirin ibuprofen and naproxen but no combinations such as sleep or sinus.

## 2017-11-21 ENCOUNTER — Ambulatory Visit (INDEPENDENT_AMBULATORY_CARE_PROVIDER_SITE_OTHER): Payer: Medicaid Other | Admitting: Family Medicine

## 2017-11-21 ENCOUNTER — Encounter: Payer: Self-pay | Admitting: Family Medicine

## 2017-11-21 VITALS — BP 124/74 | HR 84 | Temp 98.7°F | Resp 18 | Ht 64.0 in | Wt 193.0 lb

## 2017-11-21 DIAGNOSIS — B1710 Acute hepatitis C without hepatic coma: Secondary | ICD-10-CM

## 2017-11-21 DIAGNOSIS — L03211 Cellulitis of face: Secondary | ICD-10-CM

## 2017-11-21 DIAGNOSIS — Z1389 Encounter for screening for other disorder: Secondary | ICD-10-CM

## 2017-11-21 DIAGNOSIS — Z23 Encounter for immunization: Secondary | ICD-10-CM

## 2017-11-21 LAB — POCT URINALYSIS DIP (DEVICE)
Bilirubin Urine: NEGATIVE
Glucose, UA: NEGATIVE mg/dL
HGB URINE DIPSTICK: NEGATIVE
Ketones, ur: NEGATIVE mg/dL
Nitrite: NEGATIVE
PH: 5.5 (ref 5.0–8.0)
PROTEIN: NEGATIVE mg/dL
SPECIFIC GRAVITY, URINE: 1.01 (ref 1.005–1.030)
Urobilinogen, UA: 0.2 mg/dL (ref 0.0–1.0)

## 2017-11-21 MED ORDER — QUETIAPINE FUMARATE 25 MG PO TABS: 25.0000 mg | ORAL_TABLET | Freq: Every day | ORAL | 0 refills | Status: DC

## 2017-11-21 MED ORDER — QUETIAPINE FUMARATE 100 MG PO TABS: 100.0000 mg | ORAL_TABLET | Freq: Every day | ORAL | 0 refills | Status: DC

## 2017-11-21 MED ORDER — HYDROXYZINE HCL 25 MG PO TABS: 25.0000 mg | ORAL_TABLET | Freq: Four times a day (QID) | ORAL | 0 refills | Status: DC | PRN

## 2017-11-21 MED ORDER — DOXYCYCLINE HYCLATE 100 MG PO CAPS: 100.0000 mg | ORAL_CAPSULE | Freq: Two times a day (BID) | ORAL | 0 refills | Status: DC

## 2017-11-21 NOTE — Progress Notes (Signed)
Patient ID: Mia [name], female    DOB: [birth year], 42 y.o.   MRN: [id number]  PCP: Bing Neighbors, FNP  Chief Complaint  Patient presents with  . Establish Care  . Hospitalization Follow-up    Subjective:  HPI Mia [name] is a 42 y.o. female with a history of major depressive disorder, prior overdose attempts, hx polysubstance abuse, presents to establish care and hospital follow-up.  Has an extensive history of mental health and polysubstance abuse. She is accompanied today by her mother. Recently she was hospitalized for suicidal ideations and first-degree heart block can vary to an overdose attempt.  History of chronic heroin abuse.  Apparently she was using heroin in addition to hair when she took 15 tramadol and 5 Flexeril tablets in an attempt to overdose.  She had been having suicidal ideations over the last few days leading up to this event.  She is positive for hep C and has been referred to infectious disease for treatment and management.  She sustained first-degree heart block thought be substance induced. She has already followed up with cardiologist, Dr. Norman Herrlich, MD and he has recommended wearing a Halter monitor.  [name] also suffered from right facial cellulitis.  She recently had a abscess I indeed by Dr. Christia Reading or dermatologist from [facility].  She is currently not taking any additional antibiotics at this time.  However is concern for facial swelling and puffiness.  She reports mild tenderness on the right side of her face below her eye however is negative for any wounds.  She reports this infection started as a result of her popping a pimple.  Tablet is currently in drug treatment program at [facility] and treatment for opiate dependency disorder with methadone. She is scheduled to follow-up with her psychiatrist, Dr. Rickey Primus , on 12/02/2016.  She is currently tolerating Seroquel for her mood disorder. Social History   Socioeconomic History  .  Marital status: Single    Spouse name: Not on file  . Number of children: Not on file  . Years of education: Not on file  . Highest education level: Not on file  Social Needs  . Financial resource strain: Not on file  . Food insecurity - worry: Not on file  . Food insecurity - inability: Not on file  . Transportation needs - medical: Not on file  . Transportation needs - non-medical: Not on file  Occupational History  . Not on file  Tobacco Use  . Smoking status: Current Some Day Smoker    Packs/day: 1.00    Types: Cigarettes  . Smokeless tobacco: Never Used  Substance and Sexual Activity  . Alcohol use: No  . Drug use: No  . Sexual activity: Not on file  Other Topics Concern  . Not on file  Social History Narrative  . Not on file   Review of Systems  Constitutional: Positive for fatigue.  Respiratory: Negative.   Cardiovascular: Negative.   Skin: Positive for rash.  Neurological: Negative.   Psychiatric/Behavioral: Negative.    Patient Active Problem List   Diagnosis Date Noted  . Palpitation 11/08/2017  . First degree heart block 11/07/2017  . Severe recurrent major depression without psychotic features (HCC) 10/22/2017  . Periorbital cellulitis of right eye 09/26/2017  . Periorbital cellulitis 09/26/2017  . Alcohol use disorder, mild, in sustained remission 04/05/2017  . Cocaine use disorder, mild, abuse (HCC) 04/05/2017  . Moderate opioid use disorder (HCC) 04/05/2017  . Histrionic personality disorder  in adult [facility](HCC) 04/05/2017  . Tobacco use disorder 04/05/2017  . Suicidal ideation 03/01/2016  . UTI (urinary tract infection) 03/01/2016  . Bipolar 1 disorder, mixed, moderate (HCC)   . Opioid use disorder, moderate, dependence (HCC) 02/28/2016  . 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 Substance induced mood disorder (HCC) 02/28/2016  . Bipolar 1 disorder (HCC) 08/14/2013  . Anxiety state 08/10/2013    No Known  Allergies  Prior to Admission medications   Medication Sig Start Date End Date Taking? Authorizing Provider  hydrOXYzine (ATARAX/VISTARIL) 25 MG tablet Take 1 tablet (25 mg total) by mouth every 6 (six) hours as needed for anxiety. 10/25/17  Yes Armandina StammerNwoko, Agnes I, NP  methadone (METHADOSE) 40 MG disintegrating tablet Take 40 mg by mouth daily.   Yes [provider]  QUEtiapine (SEROQUEL) 100 MG tablet Take 1 tablet (100 mg total) by mouth at bedtime. For mood control 10/25/17  Yes Nwoko, Nicole KindredAgnes I, NP  QUEtiapine (SEROQUEL) 25 MG tablet Take 1 tablet (25 mg total) by mouth daily. For agitation 10/26/17  Yes Sanjuana KavaNwoko, Agnes I, NP    Past Medical, Surgical Family and Social History reviewed and updated.    Objective:   Today's Vitals   11/21/17 1326  BP: 124/74  Pulse: 84  Resp: 18  Temp: 98.7 F (37.1 C)  TempSrc: Oral  SpO2: 98%  Weight: 193 lb (87.5 kg)  Height: 5\' 4"  (1.626 m)    Wt Readings from Last 3 Encounters:  11/21/17 193 lb (87.5 kg)  11/08/17 193 lb 6.4 oz (87.7 kg)  10/22/17 185 lb (83.9 kg)    Physical Exam  Constitutional: She is oriented to person, place, and time. She appears well-developed and well-nourished.  HENT:  Head: Normocephalic and atraumatic.  Eyes: Conjunctivae and EOM are normal. Pupils are equal, round, and reactive to light.  Neck: Normal range of motion. Neck supple.  Cardiovascular: Normal rate, regular rhythm, normal heart sounds and intact distal pulses.  Pulmonary/Chest: Effort normal and breath sounds normal.  Musculoskeletal: She exhibits edema.  Lymphadenopathy:    She has cervical adenopathy.  Neurological: She is alert and oriented to person, place, and time.  Skin: Skin is warm and dry.  Psychiatric: Her behavior is normal. Judgment and thought content normal. Her mood appears anxious. Her affect is blunt. Her speech is delayed. Cognition and memory are impaired.  Requiring repeating of the same spoken information several times  during visit today.   Assessment & Plan:  1. Cellulitis of face, will trial a course of doxycycline 100 mg twice daily . Will check CBC with differential.  2. Acute hepatitis C virus infection without hepatic coma, patient already referred to infectious disease.  3. Need for immunization against influenza- Flu Vaccine QUAD 36+ mos IM  4. Need for Tdap vaccination- Tdap vaccine greater than or equal to 7yo IM  Continue follow-up with mental health provider.  You have already been referred to infectious disease please attend follow-up appointment in order to receive treatment for hep C.  Meds ordered this encounter  Medications  . hydrOXYzine (ATARAX/VISTARIL) 25 MG tablet    Sig: Take 1 tablet (25 mg total) by mouth every 6 (six) hours as needed for anxiety.    Dispense:  30 tablet    Refill:  0    Order Specific Question:   Supervising Provider    Answer:   Quentin AngstJEGEDE, OLUGBEMIGA E [id number]  . QUEtiapine (SEROQUEL) 25 MG tablet  Sig: Take 1 tablet (25 mg total) by mouth daily. For agitation    Dispense:  30 tablet    Refill:  0    Order Specific Question:   Supervising Provider    Answer:   Quentin Angst [id number]  . DISCONTD: QUEtiapine (SEROQUEL) 100 MG tablet    Sig: Take 1 tablet (100 mg total) by mouth at bedtime. For mood control    Dispense:  30 tablet    Refill:  0    Order Specific Question:   Supervising Provider    Answer:   Quentin Angst [id number]  . DISCONTD: doxycycline (VIBRAMYCIN) 100 MG capsule    Sig: Take 1 capsule (100 mg total) by mouth 2 (two) times daily.    Dispense:  20 capsule    Refill:  0    Order Specific Question:   Supervising Provider    Answer:   Quentin Angst [id number]  . doxycycline (VIBRAMYCIN) 100 MG capsule    Sig: Take 1 capsule (100 mg total) by mouth 2 (two) times daily.    Dispense:  28 capsule    Refill:  0    Order Specific Question:   Supervising Provider    Answer:   Quentin Angst [[id number]]     Orders Placed This Encounter  Procedures  . Flu Vaccine QUAD 36+ mos IM  . Tdap vaccine greater than or equal to 7yo IM  . CBC with Differential  . Basic metabolic panel  . POCT urinalysis dip (device)   Godfrey Pick. Tiburcio Pea, MSN, FNP-C [facility]  [address] [place], [place] [address] [phone]

## 2017-11-21 NOTE — Patient Instructions (Signed)
Keep scheduled follow up

## 2017-11-22 LAB — CBC WITH DIFFERENTIAL/PLATELET
Basophils Absolute: 0 10*3/uL (ref 0.0–0.2)
Basos: 1 %
EOS (ABSOLUTE): 0.2 10*3/uL (ref 0.0–0.4)
Eos: 3 %
HEMOGLOBIN: 14.6 g/dL (ref 11.1–15.9)
Hematocrit: 42.9 % (ref 34.0–46.6)
IMMATURE GRANS (ABS): 0 10*3/uL (ref 0.0–0.1)
Immature Granulocytes: 0 %
LYMPHS ABS: 2.7 10*3/uL (ref 0.7–3.1)
LYMPHS: 48 %
MCH: 31.1 pg (ref 26.6–33.0)
MCHC: 34 g/dL (ref 31.5–35.7)
MCV: 92 fL (ref 79–97)
MONOCYTES: 5 %
Monocytes Absolute: 0.3 10*3/uL (ref 0.1–0.9)
NEUTROS ABS: 2.4 10*3/uL (ref 1.4–7.0)
Neutrophils: 43 %
Platelets: 266 10*3/uL (ref 150–379)
RBC: 4.69 x10E6/uL (ref 3.77–5.28)
RDW: 14.5 % (ref 12.3–15.4)
WBC: 5.6 10*3/uL (ref 3.4–10.8)

## 2017-11-22 LAB — BASIC METABOLIC PANEL
BUN/Creatinine Ratio: 8 — ABNORMAL LOW (ref 9–23)
BUN: 6 mg/dL (ref 6–24)
CO2: 24 mmol/L (ref 20–29)
CREATININE: 0.71 mg/dL (ref 0.57–1.00)
Calcium: 9.1 mg/dL (ref 8.7–10.2)
Chloride: 98 mmol/L (ref 96–106)
GFR calc Af Amer: 123 mL/min/{1.73_m2} (ref >59)
GFR calc non Af Amer: 107 mL/min/{1.73_m2} (ref >59)
GLUCOSE: 71 mg/dL (ref 65–99)
POTASSIUM: 4.1 mmol/L (ref 3.5–5.2)
SODIUM: 137 mmol/L (ref 134–144)

## 2017-11-24 ENCOUNTER — Encounter: Payer: Self-pay | Admitting: Family Medicine

## 2017-11-24 ENCOUNTER — Telehealth: Payer: Self-pay

## 2017-11-24 NOTE — Progress Notes (Signed)
Mail lab letter  

## 2017-11-25 ENCOUNTER — Encounter: Payer: Self-pay | Admitting: Family Medicine

## 2017-11-25 MED ORDER — QUETIAPINE FUMARATE 100 MG PO TABS: 100.0000 mg | ORAL_TABLET | Freq: Every day | ORAL | 0 refills | Status: DC

## 2017-11-25 NOTE — Telephone Encounter (Signed)
Tried to contact patient no answer no vm. 

## 2017-11-28 ENCOUNTER — Ambulatory Visit (HOSPITAL_BASED_OUTPATIENT_CLINIC_OR_DEPARTMENT_OTHER): Payer: Medicaid Other

## 2017-11-28 ENCOUNTER — Ambulatory Visit: Payer: Medicaid Other

## 2017-11-28 DIAGNOSIS — R002 Palpitations: Secondary | ICD-10-CM

## 2017-11-28 DIAGNOSIS — I440 Atrioventricular block, first degree: Secondary | ICD-10-CM

## 2017-11-29 ENCOUNTER — Ambulatory Visit (INDEPENDENT_AMBULATORY_CARE_PROVIDER_SITE_OTHER): Payer: Medicaid Other | Admitting: Internal Medicine

## 2017-11-29 ENCOUNTER — Encounter: Payer: Self-pay | Admitting: Internal Medicine

## 2017-11-29 DIAGNOSIS — B182 Chronic viral hepatitis C: Secondary | ICD-10-CM | POA: Insufficient documentation

## 2017-11-29 DIAGNOSIS — B1710 Acute hepatitis C without hepatic coma: Secondary | ICD-10-CM | POA: Diagnosis present

## 2017-11-29 NOTE — Patient Instructions (Signed)
Date 11/29/17  Dear Mia Mia [name], your treatment is deferred at this time waiting confirmation.  It will include Mavyret.   ---------------------------------------------------------------- Your HCV Treatment Start Date: TBA   Your HCV genotype: unknown    Liver Fibrosis: TBD    ---------------------------------------------------------------- YOUR Mia Mia [name]:   [facility] Mia [name], [name] Phone: [phone] Hours: Monday to Friday 7:30 am to 6:00 pm   Please always Mia [name] your Mia [name] at least 3-4 business days before you run out of medications to ensure your next month's medication is ready or 1 week prior to running out if you receive it by mail.  Remember, each prescription is for 28 days. ---------------------------------------------------------------- GENERAL NOTES REGARDING YOUR HEPATITIS C MEDICATION:  Mavyret: - tablets are pink, oblong shape - take 3 tablets daily with food. - The tablets should be stored at room temperature.  - Acid reducing agents such as H2 blockers (ie. Pepcid (famotidine), Zantac (ranitidine), Tagamet (cimetidine), Axid (nizatidine) and proton pump inhibitors (ie. Prilosec (omeprazole), Protonix (pantoprazole), Nexium (esomeprazole), or Aciphex (rabeprazole)) can decrease effectiveness of Harvoni. Do not take until you have discussed with a health care provider.    -Antacids that contain magnesium and/or aluminum hydroxide (ie. Milk of Magensia, Rolaids, Gaviscon, Maalox, Mylanta, an dArthritis Pain Formula) can reduce absorption of Harvoni, so take them at least 4 hours before or after Harvoni.  -Calcium carbonate (calcium supplements or antacids such as Tums, Caltrate, Os-Cal) needs to be taken at least 4 hours hours before or after Harvoni.  -St. John's wort or any products that contain St. John's wort like some herbal supplements  Please inform the office prior to starting any of these  medications.  - The common side effects associated with Mavyret include:      1. Fatigue      2. Headache      3. Nausea      4. Diarrhea      5. Insomnia  Please note that this only lists the most common side effects and is NOT a comprehensive list of the potential side effects of these medications. For more information, please review the drug information sheets that come with your medication package from the Mia [name].  ---------------------------------------------------------------- GENERAL HELPFUL HINTS ON HCV THERAPY: 1. Stay well-hydrated. 2. Notify the [facility] Clinic of any changes in your other over-the-counter/herbal or prescription medications. 3. If you miss a dose of your medication, take the missed dose as soon as you remember. Return to your regular time/dose schedule the next day.  4.  Do not stop taking your medications without first talking with your healthcare provider. 5.  You may take Tylenol (acetaminophen), as long as the dose is less than 2000 mg (OR no more than 4 tablets of the Tylenol Extra Strengths 500mg  tablet) in 24 hours. 6.  You will see our pharmacist-specialist within the first 2 weeks of starting your medication to monitor for any possible side effects. 7.  You will have labs once during treatment, after soon after treatment completion and one final lab 6 months after treatment completion to verify the virus is out of your system.  Mia Barefootobert W Ashe Gago, MD  [facility] [address] [place], [place]  [id number] [phone]

## 2017-12-01 ENCOUNTER — Encounter: Payer: Self-pay | Admitting: Internal Medicine

## 2017-12-01 NOTE — Progress Notes (Signed)
[facility]      Reason for Consult: acute hepatitis C    Referring Physician: Dr. Tiburcio PeaHarris    Patient ID: Mia [name], female    DOB: 05/18/1976, 42 y.o.   MRN: [id number]  HPI:   She recently had a positive hepatitis C Ab with a history of drug use.  [facility] She is now off of IV drugs and recently moved here to [place]>  She is in the appt with her mother.  She does not drink any alcohol.  She has mental health issues as well and recently [facility] with [facility].  She does have a recent history of facial boils and diagnosed with MRSA infection.  She is currently taking doxycycline from her PCP with concern for 'facial swelling'.  No pus or other issues.   Care Everywhere independently reviewed and a remote negative hepatitis C Ab noted.    Past Medical History:  Diagnosis Date  . Alcohol use disorder, mild, in sustained remission 04/05/2017  . Anxiety   . Anxiety state 08/10/2013   Overview:  Updated invalid ICD 9 codes for ICD 10 go live  . Bipolar 1 disorder (HCC)   . Bipolar 1 disorder, mixed, moderate (HCC)   . Cocaine use disorder, mild, abuse (HCC) 04/05/2017  . Cystic acne   . First degree heart block 11/07/2017  . 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 Moderate opioid use disorder (HCC) 04/05/2017  . Opioid use disorder, moderate, dependence (HCC) 02/28/2016  . Periorbital cellulitis 09/26/2017  . Periorbital cellulitis of right eye 09/26/2017  . 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 Severe recurrent major depression without psychotic features (HCC) 10/22/2017  . Substance induced mood disorder (HCC) 02/28/2016  . Suicidal ideation 03/01/2016  . Tobacco use disorder 04/05/2017  . UTI (urinary tract infection) 03/01/2016    Prior to Admission medications   Medication Sig Start Date End Date Taking? Authorizing  Provider  doxycycline (VIBRAMYCIN) 100 MG capsule Take 1 capsule (100 mg total) by mouth 2 (two) times daily. 11/21/17  Yes Bing NeighborsHarris, Kimberly S, FNP  hydrOXYzine (ATARAX/VISTARIL) 25 MG tablet Take 1 tablet (25 mg total) by mouth every 6 (six) hours as needed for anxiety. 11/21/17  Yes Bing NeighborsHarris, Kimberly S, FNP  methadone (METHADOSE) 40 MG disintegrating tablet Take 40 mg by mouth daily.   Yes [provider]  QUEtiapine (SEROQUEL) 100 MG tablet Take 1 tablet (100 mg total) by mouth at bedtime. For mood control 11/25/17  Yes Bing NeighborsHarris, Kimberly S, FNP  QUEtiapine (SEROQUEL) 25 MG tablet Take 1 tablet (25 mg total) by mouth daily. For agitation 11/21/17  Yes Bing NeighborsHarris, Kimberly S, FNP    No Known Allergies  Social History   Tobacco Use  . Smoking status: Current Some Day Smoker    Packs/day: 1.00    Types: Cigarettes  . Smokeless tobacco: Never Used  Substance Use Topics  . Alcohol use: No  . Drug use: No    Family History  Problem Relation Age of Onset  . Other Mother        Denies known hx of cardiovascular, diabetes, or cancer  . Other Father        Denies known hx of cardiovascular, diabetes, or cancer    Review of Systems  Constitutional: negative for fatigue and malaise Respiratory: negative for cough Gastrointestinal: negative for  diarrhea Integument/breast: negative for rash All other systems reviewed and are negative    Constitutional: in no apparent distress and alert  Vitals:   11/29/17 1415  BP: 129/76  Pulse: (!) 110  Temp: (!) 97.3 F (36.3 C)   EYES: anicteric ENMT: no thrush Cardiovascular: Cor RRR Respiratory: CTA B; normal respiratory effort GI: Bowel sounds are normal, liver is not enlarged, spleen is not enlarged Musculoskeletal: no pedal edema noted Skin: negatives: no rash Hematologic: no cervical lad  Labs: Lab Results  Component Value Date   WBC 5.6 11/21/2017   HGB 14.6 11/21/2017   HCT 42.9 11/21/2017   MCV 92 11/21/2017   PLT 266  11/21/2017    Lab Results  Component Value Date   CREATININE 0.71 11/21/2017   BUN 6 11/21/2017   NA 137 11/21/2017   K 4.1 11/21/2017   CL 98 11/21/2017   CO2 24 11/21/2017    Lab Results  Component Value Date   ALT 38 10/21/2017   AST 27 10/21/2017   ALKPHOS 77 10/21/2017   BILITOT 0.4 10/21/2017     Assessment: Acute hepatitis C.  I discussed with her the nature of hepatitis C, infection and ability to get reinfected after treatment and the nature of acute disease.  I explained that more than 20% of people can clear the virus spontaneously within the first 6-12 months so no indication for treatment until it is confirmed to be chronic hepatitis C.    Plan: 1) RNA today 2) [place] in 3 months and repeat the RNA.   3) is positive in 3 months, will pursue treatment with oral agent and finish lab work, do regular ultrasound.

## 2017-12-02 LAB — HEPATITIS C RNA QUANTITATIVE
HCV QUANT LOG: 2.53 {Log_IU}/mL — AB
HCV RNA, PCR, QN: 340 IU/mL — ABNORMAL HIGH

## 2017-12-02 LAB — HEPATITIS C GENOTYPE: HCV Genotype: 3

## 2017-12-05 ENCOUNTER — Ambulatory Visit (INDEPENDENT_AMBULATORY_CARE_PROVIDER_SITE_OTHER): Payer: Medicaid Other | Admitting: Family Medicine

## 2017-12-05 VITALS — BP 138/74 | HR 88 | Temp 98.4°F | Resp 18 | Ht 64.0 in | Wt 201.0 lb

## 2017-12-05 DIAGNOSIS — Z1231 Encounter for screening mammogram for malignant neoplasm of breast: Secondary | ICD-10-CM | POA: Diagnosis not present

## 2017-12-05 DIAGNOSIS — Z01419 Encounter for gynecological examination (general) (routine) without abnormal findings: Secondary | ICD-10-CM

## 2017-12-05 DIAGNOSIS — Z1239 Encounter for other screening for malignant neoplasm of breast: Secondary | ICD-10-CM

## 2017-12-05 LAB — POCT URINALYSIS DIP (DEVICE)
BILIRUBIN URINE: NEGATIVE
Glucose, UA: NEGATIVE mg/dL
HGB URINE DIPSTICK: NEGATIVE
KETONES UR: NEGATIVE mg/dL
Nitrite: NEGATIVE
PH: 5.5 (ref 5.0–8.0)
PROTEIN: NEGATIVE mg/dL
Urobilinogen, UA: 0.2 mg/dL (ref 0.0–1.0)

## 2017-12-05 LAB — POCT URINE PREGNANCY: Preg Test, Ur: NEGATIVE

## 2017-12-05 NOTE — Progress Notes (Signed)
Patient ID: Mia [name], female    DOB: 1976/07/02, 42 y.o.   MRN: [id number]  PCP: Bing Neighbors, FNP  Chief Complaint  Patient presents with  . Gynecologic Exam    Subjective:  HPI Mia [name] is a 42 y.o. female with a history of opioid use disorder, bipolar 1 disorder, alcohol use, presents for gynecological exam. She is sexually active and reports patient's last menstrual period was 11/17/2017.  She is sexually active and uses condoms for birth control method. Denies known history of abnormal PAPs. Reports a family history of breast cancer on maternal side only. She has not had a recent mammogram as she recently turned 42 years old in December. Denies any other questions or concerns. Social History   Socioeconomic History  . Marital status: Single    Spouse name: Not on file  . Number of children: Not on file  . Years of education: Not on file  . Highest education level: Not on file  Social Needs  . Financial resource strain: Not on file  . Food insecurity - worry: Not on file  . Food insecurity - inability: Not on file  . Transportation needs - medical: Not on file  . Transportation needs - non-medical: Not on file  Occupational History  . Not on file  Tobacco Use  . Smoking status: Current Some Day Smoker    Packs/day: 1.00    Types: Cigarettes  . Smokeless tobacco: Never Used  Substance and Sexual Activity  . Alcohol use: No  . Drug use: No  . Sexual activity: Not on file  Other Topics Concern  . Not on file  Social History Narrative  . Not on file    Family History  Problem Relation Age of Onset  . Other Mother        Denies known hx of cardiovascular, diabetes, or cancer  . Other Father        Denies known hx of cardiovascular, diabetes, or cancer   Review of Systems Pertinent negatives indicated in HPI Patient Active Problem List   Diagnosis Date Noted  . Acute hepatitis C 11/29/2017  . Palpitation 11/08/2017  . First degree  heart block 11/07/2017  . Severe recurrent major depression without psychotic features (HCC) 10/22/2017  . Periorbital cellulitis of right eye 09/26/2017  . Periorbital cellulitis 09/26/2017  . Alcohol use disorder, mild, in sustained remission 04/05/2017  . Cocaine use disorder, mild, abuse (HCC) 04/05/2017  . Moderate opioid use disorder (HCC) 04/05/2017  . 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 Tobacco use disorder 04/05/2017  . Suicidal ideation 03/01/2016  . UTI (urinary tract infection) 03/01/2016  . Bipolar 1 disorder, mixed, moderate (HCC)   . Opioid use disorder, moderate, dependence (HCC) 02/28/2016  . 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 Substance induced mood disorder (HCC) 02/28/2016  . Bipolar 1 disorder (HCC) 08/14/2013  . Anxiety state 08/10/2013    No Known Allergies  Prior to Admission medications   Medication Sig Start Date End Date Taking? Authorizing Provider  hydrOXYzine (ATARAX/VISTARIL) 25 MG tablet Take 1 tablet (25 mg total) by mouth every 6 (six) hours as needed for anxiety. 11/21/17  Yes Bing Neighbors, FNP  methadone (METHADOSE) 40 MG disintegrating tablet Take 40 mg by mouth daily.   Yes [provider]  QUEtiapine (SEROQUEL) 100 MG tablet Take 1 tablet (100 mg total) by mouth at bedtime. For mood control 11/25/17  Yes Tiburcio Pea,  Godfrey PickKimberly S, FNP  amitriptyline (ELAVIL) 100 MG tablet Take 100 mg by mouth daily. 12/02/17   [provider]  doxycycline (VIBRAMYCIN) 100 MG capsule Take 1 capsule (100 mg total) by mouth 2 (two) times daily. Patient not taking: Reported on 12/05/2017 11/21/17   Bing NeighborsHarris, Haileigh Pitz S, FNP  QUEtiapine (SEROQUEL) 25 MG tablet Take 1 tablet (25 mg total) by mouth daily. For agitation 11/21/17   Bing NeighborsHarris, Keena Heesch S, FNP    Past Medical, Surgical Family and Social History reviewed and updated.    Objective:   Today's Vitals   12/05/17 1440  BP: 138/74   Pulse: 88  Resp: 18  Temp: 98.4 F (36.9 C)  TempSrc: Oral  SpO2: 98%  Weight: 201 lb (91.2 kg)  Height: 5\' 4"  (1.626 m)    Wt Readings from Last 3 Encounters:  12/05/17 201 lb (91.2 kg)  11/29/17 196 lb (88.9 kg)  11/21/17 193 lb (87.5 kg)    Physical Exam  Constitutional: She is oriented to person, place, and time. She appears well-developed and well-nourished.  HENT:  Head: Normocephalic and atraumatic.  Eyes: Pupils are equal, round, and reactive to light.  Neck: Normal range of motion.  Cardiovascular: Normal rate.  Pulmonary/Chest: Effort normal.  Genitourinary: Uterus normal. Vaginal discharge found.  Genitourinary Comments: Breasts are symmetric without cutaneous changes, nipple inversion or discharge. No masses or tenderness, and no axillary lymphadenopathy. Normal female external genitalia without lesion. No inguinal lymphadenopathy. Vaginal mucosa is pink and moist without lesions. Cervix has presence of thick yellow-greenish discharge, not friable. Pap smear obtained. No cervical motion tenderness, adnexal fullness or tenderness.  Neurological: She is alert and oriented to person, place, and time.  Skin: Skin is warm and dry.  Psychiatric: She has a normal mood and affect. Her behavior is normal. Judgment and thought content normal.   Assessment & Plan:  1. Screening breast examination MM Digital Screening; Future. Patient given information to call to schedule appointment to have screening performed.  2. Pap smear, as part of routine gynecological examination,  POCT urine pregnancy, negative. - Pap IG, CT/NG w/ reflex HPV when [facility])-pending   You will be notified by phone of any abnormal results and a letter will be mailed if results are normal.  -keep scheduled follow-up on file   Godfrey PickKimberly S. Tiburcio PeaHarris, MSN, FNP-C [facility]  [address] [place], [place] [address] [phone]

## 2017-12-05 NOTE — Patient Instructions (Addendum)
To schedule your breast exam, please call [facility] at   Phone: [phone]  Mammogram A mammogram is an X-ray of the breasts that is done to check for changes that are not normal. This test can screen for and find any changes that may suggest breast cancer. This test can also help to find other changes and variations in the breast. What happens before the procedure?  Have this test done about 1-2 weeks after your period. This is usually when your breasts are the least tender.  If you are visiting a new doctor or clinic, send any past mammogram images to your new doctor's office.  Wash your breasts and under your arms the day of the test.  Do not use deodorants, perfumes, lotions, or powders on the day of the test.  Take off any jewelry from your neck.  Wear clothes that you can change into and out of easily. What happens during the procedure?  You will undress from the waist up. You will put on a gown.  You will stand in front of the X-ray machine.  Each breast will be placed between two plastic or glass plates. The plates will press down on your breast for a few seconds. Try to stay as relaxed as possible. This does not cause any harm to your breasts. Any discomfort you feel will be very brief.  X-rays will be taken from different angles of each breast. The procedure may vary among doctors and hospitals. What happens after the procedure?  The mammogram will be looked at by a specialist (radiologist).  You may need to do certain parts of the test again. This depends on the quality of the images.  Ask when your test results will be ready. Make sure you get your test results.  You may go back to your normal activities. This information is not intended to replace advice given to you by your health care provider. Make sure you discuss any questions you have with your health care provider. Document Released: 12/31/2008 Document Revised: 03/11/2016 Document  Reviewed: 12/13/2014 Elsevier Interactive Patient Education  2018 [employer]. Pap Test Why am I having this test? A pap test is sometimes called a pap smear. It is a screening test that is used to check for signs of cancer of the vagina, cervix, and uterus. The test can also identify the presence of infection or precancerous changes. Your health care provider will likely recommend you have this test done on a regular basis. This test may be done:  Every 3 years, starting at age 41.  Every 5 years, in combination with testing for the presence of human papillomavirus (HPV).  More or less often depending on other medical conditions.  What kind of sample is taken? Using a small cotton swab, plastic spatula, or brush, your health care provider will collect a sample of cells from the surface of your cervix. Your cervix is the opening to your uterus, also called a womb. Secretions from the cervix and vagina may also be collected. How do I prepare for this test?  Be aware of where you are in your menstrual cycle. You may be asked to reschedule the test if you are menstruating on the day of the test.  You may need to reschedule if you have a known vaginal infection on the day of the test.  You may be asked to avoid douching or taking a bath the day before or the day of the test.  Some medicines  can cause abnormal test results, such as digitalis and tetracycline. Talk with your health care provider before your test if you take one of these medicines. What do the results mean? Abnormal test results may indicate a number of health conditions. These may include:  Cancer. Although pap test results cannot be used to diagnose cancer of the cervix, vagina, or uterus, they may suggest the possibility of cancer. Further tests would be required to determine if cancer is present.  Sexually transmitted disease.  Fungal infection.  Parasite infection.  Herpes infection.  A condition causing or  contributing to infertility.  It is your responsibility to obtain your test results. Ask the lab or department performing the test when and how you will get your results. Contact your health care provider to discuss any questions you have about your results. Talk with your health care provider to discuss your results, treatment options, and if necessary, the need for more tests. Talk with your health care provider if you have any questions about your results. This information is not intended to replace advice given to you by your health care provider. Make sure you discuss any questions you have with your health care provider. Document Released: 12/25/2002 Document Revised: 06/09/2016 Document Reviewed: 02/25/2014 Elsevier Interactive Patient Education  [employer]2018 Elsevier Inc.

## 2017-12-08 ENCOUNTER — Telehealth: Payer: Self-pay | Admitting: *Deleted

## 2017-12-08 LAB — PAP IG, CT-NG, RFX HPV ASCU
Chlamydia, Nuc. Acid Amp: NEGATIVE
GONOCOCCUS BY NUCLEIC ACID AMP: NEGATIVE
PAP Smear Comment: 0

## 2017-12-08 NOTE — Telephone Encounter (Signed)
Patient called to get her numbers advised her and she has questions. Advised her will have a pharmacist or [place] give her a call back to explain. She also has questions about medication.

## 2017-12-08 NOTE — Telephone Encounter (Signed)
As discussed at the visit, it means it is still active and will repeat in 3 months to see if it remains so and if she needs treatment after that time.  She was acute and 20% of people clear spontaneously within 6-12 months.     thanks

## 2017-12-09 ENCOUNTER — Telehealth: Payer: Self-pay | Admitting: Family Medicine

## 2017-12-09 MED ORDER — METRONIDAZOLE 500 MG PO TABS: 2000.0000 mg | ORAL_TABLET | Freq: Once | ORAL | 0 refills | Status: AC

## 2017-12-09 NOTE — Telephone Encounter (Signed)
Contact patient to advise her PAP smear was negative of abnormal cells, however was positive for trichomonas. We will treat with metronidazole 2 grams once. Do not consume alcohol for a least 7 days.   Godfrey PickKimberly S. Tiburcio PeaHarris, MSN, FNP-C [facility] [address] [place], [place] [address] [phone]

## 2017-12-11 ENCOUNTER — Encounter: Payer: Self-pay | Admitting: Family Medicine

## 2017-12-12 ENCOUNTER — Telehealth: Payer: Self-pay | Admitting: Pharmacist Clinician (PhC)/ Clinical Pharmacy Specialist

## 2017-12-12 NOTE — Telephone Encounter (Signed)
Called her back to let her know that she needs to come back in 3 mo for a repeat blood test to see if she can resolve on her own.

## 2017-12-12 NOTE — Progress Notes (Deleted)
Cardiology Office Note:    Date:  12/12/2017   ID:  Mia [name], DOB [birth year], MRN [id number]  PCP:  Bing Neighbors, FNP  Cardiologist:  Norman Herrlich, MD    Referring MD: No ref. provider found    ASSESSMENT:    No diagnosis found. PLAN:    In order of problems listed above:  1. ***   Next appointment: ***   Medication Adjustments/Labs and Tests Ordered: Current medicines are reviewed at length with the patient today.  Concerns regarding medicines are outlined above.  No orders of the defined types were placed in this encounter.  No orders of the defined types were placed in this encounter.   No chief complaint on file.   History of Present Illness:    Mia [name] is a 42 y.o. female with a hx of abnormal EKG marked first degree AV block PR 287 msecs at the request of the discharge physician at behavioral health with methadone treatment  last seen one month ago.In the interim she is found to have hepatitis C and MRSA treated with doxycycline. She did not have the echo ordered at last visit. Compliance with diet, lifestyle and medications: *** Past Medical History:  Diagnosis Date  . Alcohol use disorder, mild, in sustained remission 04/05/2017  . Anxiety   . Anxiety state 08/10/2013   Overview:  Updated invalid ICD 9 codes for ICD 10 go live  . Bipolar 1 disorder (HCC)   . Bipolar 1 disorder, mixed, moderate (HCC)   . Cocaine use disorder, mild, abuse (HCC) 04/05/2017  . Cystic acne   . First degree heart block 11/07/2017  . 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 Moderate opioid use disorder (HCC) 04/05/2017  . Opioid use disorder, moderate, dependence (HCC) 02/28/2016  . Periorbital cellulitis 09/26/2017  . Periorbital cellulitis of right eye 09/26/2017  . 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 Severe recurrent major depression without psychotic features (HCC) 10/22/2017  .  Substance induced mood disorder (HCC) 02/28/2016  . Suicidal ideation 03/01/2016  . Tobacco use disorder 04/05/2017  . UTI (urinary tract infection) 03/01/2016    Past Surgical History:  Procedure Laterality Date  . CESAREAN SECTION      Current Medications: No outpatient medications have been marked as taking for the 12/13/17 encounter (Appointment) with Baldo Daub, MD.     Allergies:   Patient has no known allergies.   Social History   Socioeconomic History  . Marital status: Single    Spouse name: Not on file  . Number of children: Not on file  . Years of education: Not on file  . Highest education level: Not on file  Social Needs  . Financial resource strain: Not on file  . Food insecurity - worry: Not on file  . Food insecurity - inability: Not on file  . Transportation needs - medical: Not on file  . Transportation needs - non-medical: Not on file  Occupational History  . Not on file  Tobacco Use  . Smoking status: Current Some Day Smoker    Packs/day: 1.00    Types: Cigarettes  . Smokeless tobacco: Never Used  Substance and Sexual Activity  . Alcohol use: No  . Drug use: No  . Sexual activity: Not on file  Other Topics Concern  . Not on file  Social History Narrative  . Not on file     Family History: The patient's ***family history includes  Other in her father and mother. ROS:   Please see the history of present illness.    All other systems reviewed and are negative.  EKGs/Labs/Other Studies Reviewed:    The following studies were reviewed today:  EKG:  EKG ordered today.  The ekg ordered today demonstrates ***  Holter monitor: Study Highlights   Date of test:                 11/28/17 Duration of test:           48 Hours Indication:                    Palpitation Ordering physician:     Dr Dulce SellarMunley Interpreting physician: Dr Dulce SellarMunley Baseline rhythm: Sinus rhythm Minimum heart rate 48  average heart rate 83 maximal heart rate 147 BPM Atrial  arrhythmia: None Ventricular arrhythmia: rare 5 isolated PVC's Conduction abnormality: None, longest R-R interval 1.4 seca QT interval: Normal, average 360 msecs QTC Symptoms: None  Conclusion: Normal Holter Monitor   Recent Labs: 09/30/2017: Magnesium 2.0 10/21/2017: ALT 38 10/23/2017: TSH 1.146 11/21/2017: BUN 6; Creatinine, Ser 0.71; Hemoglobin 14.6; Platelets 266; Potassium 4.1; Sodium 137  Recent Lipid Panel    Component Value Date/Time   CHOL 168 10/23/2017 0648   TRIG 92 10/23/2017 0648   HDL 61 10/23/2017 0648   CHOLHDL 2.8 10/23/2017 0648   VLDL 18 10/23/2017 0648   LDLCALC 89 10/23/2017 0648    Physical Exam:    VS:  LMP 11/17/2017     Wt Readings from Last 3 Encounters:  12/05/17 201 lb (91.2 kg)  11/29/17 196 lb (88.9 kg)  11/21/17 193 lb (87.5 kg)     GEN: *** Well nourished, well developed in no acute distress HEENT: Normal NECK: No JVD; No carotid bruits LYMPHATICS: No lymphadenopathy CARDIAC: ***RRR, no murmurs, rubs, gallops RESPIRATORY:  Clear to auscultation without rales, wheezing or rhonchi  ABDOMEN: Soft, non-tender, non-distended MUSCULOSKELETAL:  No edema; No deformity  SKIN: Warm and dry NEUROLOGIC:  Alert and oriented x 3 PSYCHIATRIC:  Normal affect    Signed, Norman HerrlichBrian Munley, MD  12/12/2017 12:59 PM    [facility]

## 2017-12-13 ENCOUNTER — Ambulatory Visit: Payer: Medicaid Other | Admitting: Cardiology

## 2017-12-16 ENCOUNTER — Ambulatory Visit (HOSPITAL_BASED_OUTPATIENT_CLINIC_OR_DEPARTMENT_OTHER)
Admission: RE | Admit: 2017-12-16 | Discharge: 2017-12-16 | Disposition: A | Discharge status: Home / Self Care | Payer: Medicaid Other | Source: Ambulatory Visit | Attending: Cardiology | Admitting: Cardiology

## 2017-12-16 DIAGNOSIS — R002 Palpitations: Secondary | ICD-10-CM | POA: Diagnosis not present

## 2017-12-16 DIAGNOSIS — I440 Atrioventricular block, first degree: Secondary | ICD-10-CM | POA: Insufficient documentation

## 2017-12-16 NOTE — Progress Notes (Signed)
  Echocardiogram 2D Echocardiogram has been performed.  Dorothey BasemanReel, Rich Paprocki M 12/16/2017, 10:09 AM

## 2017-12-20 ENCOUNTER — Ambulatory Visit: Payer: Self-pay | Admitting: Family Medicine

## 2017-12-20 DIAGNOSIS — Z79899 Other long term (current) drug therapy: Secondary | ICD-10-CM | POA: Insufficient documentation

## 2017-12-20 NOTE — Progress Notes (Signed)
Cardiology Office Note:    Date:  12/20/2017   ID:  Mia [name], DOB 1975-12-19, MRN [id number]  PCP:  Bing Neighbors, FNP  Cardiologist:  Norman Herrlich, MD    Referring MD: Bing Neighbors, FNP    ASSESSMENT:    1. First degree heart block   2. High risk medication use    PLAN:    In order of problems listed above:  1. Stable asymptomatic no contraindication of methadone or psychiatric medications 2. Stable normal QT interval   Next appointment: 6 months   Medication Adjustments/Labs and Tests Ordered: Current medicines are reviewed at length with the patient today.  Concerns regarding medicines are outlined above.  No orders of the defined types were placed in this encounter.  No orders of the defined types were placed in this encounter.   No chief complaint on file.   History of Present Illness:    Mia [name] is a 42 y.o. female with a hx of abnormal EKG marked first degree AV block  last seen 11/08/17. ASSESSMENT:  11/08/17   1. First degree heart block   2. Palpitation    PLAN:    In order of problems listed above:          For further evaluation view of her high risk status taking multiple drugs with the effect to potentially prolong QT interval and symptoms of palpitation I asked to utilize a 48-hour Holter monitor echocardiogram to screen for underlying heart disease.  I stressed with patient and her mother that she is still every provider off medication she can take and that any medications need to be screened with her psychiatrist.  When first seen by me she did not tell if she was taking promethazine or methadone.  She relates a vague history of heart murmur 20 years ago no heart disease no murmur on exam repeat echocardiogram has been requested.  Compliance with diet, lifestyle and medications: Yes  She is frustrated she is gained 10 pounds in the last month attributed to her psychiatric medications of methadone.  I asked her to  sodium restrict 2030 minutes of activity per day and she has had no symptoms of palpitation chest pain or syncope.  Her EKG today shows a normal QT interval and continued first-degree heart block.  Holter monitor showed a normal QT interval throughout no arrhythmia echocardiogram is a structurally normal heart.  There is no contraindication of methadone her current psychiatric medications. Past Medical History:  Diagnosis Date  . Alcohol use disorder, mild, in sustained remission 04/05/2017  . Anxiety   . Anxiety state 08/10/2013   Overview:  Updated invalid ICD 9 codes for ICD 10 go live  . Bipolar 1 disorder (HCC)   . Bipolar 1 disorder, mixed, moderate (HCC)   . Cocaine use disorder, mild, abuse (HCC) 04/05/2017  . Cystic acne   . First degree heart block 11/07/2017  . 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 Moderate opioid use disorder (HCC) 04/05/2017  . Opioid use disorder, moderate, dependence (HCC) 02/28/2016  . Periorbital cellulitis 09/26/2017  . Periorbital cellulitis of right eye 09/26/2017  . 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 Severe recurrent major depression without psychotic features (HCC) 10/22/2017  . Substance induced mood disorder (HCC) 02/28/2016  . Suicidal ideation 03/01/2016  . Tobacco use disorder 04/05/2017  . UTI (urinary tract infection) 03/01/2016    Past Surgical History:  Procedure Laterality Date  .  CESAREAN SECTION      Current Medications: No outpatient medications have been marked as taking for the 12/21/17 encounter (Appointment) with Baldo Daub, MD.     Allergies:   Patient has no known allergies.   Social History   Socioeconomic History  . Marital status: Single    Spouse name: Not on file  . Number of children: Not on file  . Years of education: Not on file  . Highest education level: Not on file  Social Needs  . Financial resource strain: Not on file  . Food insecurity -  worry: Not on file  . Food insecurity - inability: Not on file  . Transportation needs - medical: Not on file  . Transportation needs - non-medical: Not on file  Occupational History  . Not on file  Tobacco Use  . Smoking status: Current Some Day Smoker    Packs/day: 1.00    Types: Cigarettes  . Smokeless tobacco: Never Used  Substance and Sexual Activity  . Alcohol use: No  . Drug use: No  . Sexual activity: Not on file  Other Topics Concern  . Not on file  Social History Narrative  . Not on file     Family History: The patient's family history includes Other in her father and mother. ROS:   Please see the history of present illness.    All other systems reviewed and are negative.  EKGs/Labs/Other Studies Reviewed:    The following studies were reviewed today:  EKG:  EKG ordered today.  The ekg ordered today demonstrates sinus rhythm first-degree heart block normal QT corrected interval  Echo 12/16/17: Study Conclusions - Left ventricle: The cavity size was normal. Systolic function was normal. The estimated ejection fraction was in the range of 60%   to 65%. Wall motion was normal; there were no regional wall motion abnormalities.  Holter monitor: Date of test:                 11/28/17 Duration of test:           48 Hours Indication:                    Palpitation Ordering physician:     Dr Dulce Sellar Interpreting physician: Dr Dulce Sellar Baseline rhythm: Sinus rhythm Minimum heart rate 48  average heart rate 83 maximal heart rate 147 BPM Atrial arrhythmia: None Ventricular arrhythmia: rare 5 isolated PVC's Conduction abnormality: None, longest R-R interval 1.4 seca QT interval: Normal, average 360 msecs QTC Symptoms: None  Conclusion: Normal Holter Monitor  Recent Labs: 09/30/2017: Magnesium 2.0 10/21/2017: ALT 38 10/23/2017: TSH 1.146 11/21/2017: BUN 6; Creatinine, Ser 0.71; Hemoglobin 14.6; Platelets 266; Potassium 4.1; Sodium 137  Recent Lipid Panel    Component  Value Date/Time   CHOL 168 10/23/2017 0648   TRIG 92 10/23/2017 0648   HDL 61 10/23/2017 0648   CHOLHDL 2.8 10/23/2017 0648   VLDL 18 10/23/2017 0648   LDLCALC 89 10/23/2017 0648    Physical Exam:    VS:  There were no vitals taken for this visit.    Wt Readings from Last 3 Encounters:  12/05/17 201 lb (91.2 kg)  11/29/17 196 lb (88.9 kg)  11/21/17 193 lb (87.5 kg)     GEN:  Well nourished, well developed in no acute distress HEENT: Normal NECK: No JVD; No carotid bruits LYMPHATICS: No lymphadenopathy CARDIAC: RRR, no murmurs, rubs, gallops RESPIRATORY:  Clear to auscultation without rales, wheezing or rhonchi  ABDOMEN: Soft, non-tender, non-distended MUSCULOSKELETAL:  No edema; No deformity  SKIN: Warm and dry NEUROLOGIC:  Alert and oriented x 3 PSYCHIATRIC:  Normal affect    Signed, Norman HerrlichBrian Munley, MD  12/20/2017 11:32 AM    [facility]

## 2017-12-21 ENCOUNTER — Encounter: Payer: Self-pay | Admitting: Cardiology

## 2017-12-21 ENCOUNTER — Ambulatory Visit (INDEPENDENT_AMBULATORY_CARE_PROVIDER_SITE_OTHER): Payer: Medicaid Other | Admitting: Cardiology

## 2017-12-21 VITALS — BP 122/82 | HR 78 | Ht 64.0 in | Wt 206.1 lb

## 2017-12-21 DIAGNOSIS — I440 Atrioventricular block, first degree: Secondary | ICD-10-CM

## 2017-12-21 DIAGNOSIS — Z79899 Other long term (current) drug therapy: Secondary | ICD-10-CM

## 2017-12-21 NOTE — Patient Instructions (Addendum)
Medication Instructions:  Your physician recommends that you continue on your current medications as directed. Please refer to the Current Medication list given to you today.   Labwork: None  Testing/Procedures: You had an EKG today.  Follow-Up: Your physician wants you to follow-up in: 6 months. You will receive a reminder letter in the mail two months in advance. If you don't receive a letter, please call our office to schedule the follow-up appointment.   Any Other Special Instructions Will Be Listed Below (If Applicable).     If you need a refill on your cardiac medications before your next appointment, please call your pharmacy.   30 minutes of activity daily    DASH diet: Healthy eating to lower your blood pressure The DASH diet emphasizes portion size, eating a variety of foods and getting the right amount of nutrients. Discover how DASH can improve your health and lower your blood pressure. By [facility] Staff  DASH stands for Dietary Approaches to Stop Hypertension. The DASH diet is a lifelong approach to healthy eating that's designed to help treat or prevent high blood pressure (hypertension). The DASH diet encourages you to reduce the sodium in your diet and eat a variety of foods rich in nutrients that help lower blood pressure, such as potassium, calcium and magnesium. By following the DASH diet, you may be able to reduce your blood pressure by a few points in just two weeks. Over time, your systolic blood pressure could drop by eight to 14 points, which can make a significant difference in your health risks. Because the DASH diet is a healthy way of eating, it offers health benefits besides just lowering blood pressure. The DASH diet is also in line with dietary recommendations to prevent osteoporosis, cancer, heart disease, stroke and diabetes. DASH diet: Sodium levels The DASH diet emphasizes vegetables, fruits and low-fat dairy foods - and moderate amounts of  whole grains, fish, poultry and nuts. In addition to the standard DASH diet, there is also a lower sodium version of the diet. You can choose the version of the diet that meets your health needs: Standard DASH diet. You can consume up to 2,300 milligrams (mg) of sodium a day.  Lower sodium DASH diet. You can consume up to 1,500 mg of sodium a day. Both versions of the DASH diet aim to reduce the amount of sodium in your diet compared with what you might get in a typical American diet, which can amount to a whopping 3,400 mg of sodium a day or more. The standard DASH diet meets the recommendation from the Dietary Guidelines for Americans to keep daily sodium intake to less than 2,300 mg a day. The American Heart Association recommends 1,500 mg a day of sodium as an upper limit for all adults. If you aren't sure what sodium level is right for you, talk to your doctor. DASH diet: What to eat Both versions of the DASH diet include lots of whole grains, fruits, vegetables and low-fat dairy products. The DASH diet also includes some fish, poultry and legumes, and encourages a small amount of nuts and seeds a few times a week.  You can eat red meat, sweets and fats in small amounts. The DASH diet is low in saturated fat, cholesterol and total fat. Here's a look at the recommended servings from each food group for the 2,000-calorie-a-day DASH diet. Grains: 6 to 8 servings a day Grains include bread, cereal, rice and pasta. Examples of one serving of grains include  1 slice whole-wheat bread, 1 ounce dry cereal, or 1/2 cup cooked cereal, rice or pasta. Focus on whole grains because they have more fiber and nutrients than do refined grains. For instance, use brown rice instead of white rice, whole-wheat pasta instead of regular pasta and whole-grain bread instead of white bread. Look for products labeled "100 percent whole grain" or "100 percent whole wheat."  Grains are naturally low in fat. Keep them this way  by avoiding butter, cream and cheese sauces. Vegetables: 4 to 5 servings a day Tomatoes, carrots, broccoli, sweet potatoes, greens and other vegetables are full of fiber, vitamins, and such minerals as potassium and magnesium. Examples of one serving include 1 cup raw leafy green vegetables or 1/2 cup cut-up raw or cooked vegetables. Don't think of vegetables only as side dishes - a hearty blend of vegetables served over brown rice or whole-wheat noodles can serve as the main dish for a meal.  Fresh and frozen vegetables are both good choices. When buying frozen and canned vegetables, choose those labeled as low sodium or without added salt.  To increase the number of servings you fit in daily, be creative. In a stir-fry, for instance, cut the amount of meat in half and double up on the vegetables. Fruits: 4 to 5 servings a day Many fruits need little preparation to become a healthy part of a meal or snack. Like vegetables, they're packed with fiber, potassium and magnesium and are typically low in fat - coconuts are an exception. Examples of one serving include one medium fruit, 1/2 cup fresh, frozen or canned fruit, or 4 ounces of juice. Have a piece of fruit with meals and one as a snack, then round out your day with a dessert of fresh fruits topped with a dollop of low-fat yogurt.  Leave on edible peels whenever possible. The peels of apples, pears and most fruits with pits add interesting texture to recipes and contain healthy nutrients and fiber.  Remember that citrus fruits and juices, such as grapefruit, can interact with certain medications, so check with your doctor or pharmacist to see if they're OK for you.  If you choose canned fruit or juice, make sure no sugar is added. Dairy: 2 to 3 servings a day Milk, yogurt, cheese and other dairy products are major sources of calcium, vitamin D and protein. But the key is to make sure that you choose dairy products that are low fat or fat-free  because otherwise they can be a major source of fat - and most of it is saturated. Examples of one serving include 1 cup skim or 1 percent milk, 1 cup low fat yogurt, or 1 1/2 ounces part-skim cheese. Low-fat or fat-free frozen yogurt can help you boost the amount of dairy products you eat while offering a sweet treat. Add fruit for a healthy twist.  If you have trouble digesting dairy products, choose lactose-free products or consider taking an over-the-counter product that contains the enzyme lactase, which can reduce or prevent the symptoms of lactose intolerance.  Go easy on regular and even fat-free cheeses because they are typically high in sodium. Lean meat, poultry and fish: 6 servings or fewer a day Meat can be a rich source of protein, B vitamins, iron and zinc. Choose lean varieties and aim for no more than 6 ounces a day. Cutting back on your meat portion will allow room for more vegetables. Trim away skin and fat from poultry and meat and then bake, broil,  grill or roast instead of frying in fat.  Eat heart-healthy fish, such as salmon, herring and tuna. These types of fish are high in omega-3 fatty acids, which can help lower your total cholesterol. Nuts, seeds and legumes: 4 to 5 servings a week Almonds, sunflower seeds, kidney beans, peas, lentils and other foods in this family are good sources of magnesium, potassium and protein. They're also full of fiber and phytochemicals, which are plant compounds that may protect against some cancers and cardiovascular disease. Serving sizes are small and are intended to be consumed only a few times a week because these foods are high in calories. Examples of one serving include 1/3 cup nuts, 2 tablespoons seeds, or 1/2 cup cooked beans or peas.  Nuts sometimes get a bad rap because of their fat content, but they contain healthy types of fat - monounsaturated fat and omega-3 fatty acids. They're high in calories, however, so eat them in moderation.  Try adding them to stir-fries, salads or cereals.  Soybean-based products, such as tofu and tempeh, can be a good alternative to meat because they contain all of the amino acids your body needs to make a complete protein, just like meat. Fats and oils: 2 to 3 servings a day Fat helps your body absorb essential vitamins and helps your body's immune system. But too much fat increases your risk of heart disease, diabetes and obesity. The DASH diet strives for a healthy balance by limiting total fat to less than 30 percent of daily calories from fat, with a focus on the healthier monounsaturated fats. Examples of one serving include 1 teaspoon soft margarine, 1 tablespoon mayonnaise or 2 tablespoons salad dressing. Saturated fat and trans fat are the main dietary culprits in increasing your risk of coronary artery disease. DASH helps keep your daily saturated fat to less than 6 percent of your total calories by limiting use of meat, butter, cheese, whole milk, cream and eggs in your diet, along with foods made from lard, solid shortenings, and palm and coconut oils.  Avoid trans fat, commonly found in such processed foods as crackers, baked goods and fried items.  Read food labels on margarine and salad dressing so that you can choose those that are lowest in saturated fat and free of trans fat. Sweets: 5 servings or fewer a week You don't have to banish sweets entirely while following the DASH diet - just go easy on them. Examples of one serving include 1 tablespoon sugar, jelly or jam, 1/2 cup sorbet, or 1 cup lemonade. When you eat sweets, choose those that are fat-free or low-fat, such as sorbets, fruit ices, jelly beans, hard candy, graham crackers or low-fat cookies.  Artificial sweeteners such as aspartame (NutraSweet, Equal) and sucralose (Splenda) may help satisfy your sweet tooth while sparing the sugar. But remember that you still must use them sensibly. It's OK to swap a diet cola for a regular  cola, but not in place of a more nutritious beverage such as low-fat milk or even plain water.  Cut back on added sugar, which has no nutritional value but can pack on calories. DASH diet: Alcohol and caffeine Drinking too much alcohol can increase blood pressure. The Dietary Guidelines for Americans recommends that men limit alcohol to no more than two drinks a day and women to one or less. The DASH diet doesn't address caffeine consumption. The influence of caffeine on blood pressure remains unclear. But caffeine can cause your blood pressure to rise at least  temporarily. If you already have high blood pressure or if you think caffeine is affecting your blood pressure, talk to your doctor about your caffeine consumption. DASH diet and weight loss While the DASH diet is not a weight-loss program, you may indeed lose unwanted pounds because it can help guide you toward healthier food choices. The DASH diet generally includes about 2,000 calories a day. If you're trying to lose weight, you may need to eat fewer calories. You may also need to adjust your serving goals based on your individual circumstances - something your health care team can help you decide. Tips to cut back on sodium The foods at the core of the DASH diet are naturally low in sodium. So just by following the DASH diet, you're likely to reduce your sodium intake. You also reduce sodium further by: Using sodium-free spices or flavorings with your food instead of salt  Not adding salt when cooking rice, pasta or hot cereal  Rinsing canned foods to remove some of the sodium  Buying foods labeled "no salt added," "sodium-free," "low sodium" or "very low sodium" One teaspoon of table salt has 2,325 mg of sodium. When you read food labels, you may be surprised at just how much sodium some processed foods contain. Even low-fat soups, canned vegetables, ready-to-eat cereals and sliced [place] from the local deli - foods you may have considered  healthy - often have lots of sodium. You may notice a difference in taste when you choose low-sodium food and beverages. If things seem too bland, gradually introduce low-sodium foods and cut back on table salt until you reach your sodium goal. That'll give your palate time to adjust. Using salt-free seasoning blends or herbs and spices may also ease the transition. It can take several weeks for your taste buds to get used to less salty foods. Putting the pieces of the DASH diet together Try these strategies to get started on the DASH diet:  Change gradually. If you now eat only one or two servings of fruits or vegetables a day, try to add a serving at lunch and one at dinner. Rather than switching to all whole grains, start by making one or two of your grain servings whole grains. Increasing fruits, vegetables and whole grains gradually can also help prevent bloating or diarrhea that may occur if you aren't used to eating a diet with lots of fiber. You can also try over-the-counter products to help reduce gas from beans and vegetables.  Reward successes and forgive slip-ups. Reward yourself with a nonfood treat for your accomplishments - rent a movie, purchase a book or get together with a friend. Everyone slips, especially when learning something new. Remember that changing your lifestyle is a long-term process. Find out what triggered your setback and then just pick up where you left off with the DASH diet.  Add physical activity. To boost your blood pressure lowering efforts even more, consider increasing your physical activity in addition to following the DASH diet. Combining both the DASH diet and physical activity makes it more likely that you'll reduce your blood pressure.  Get support if you need it. If you're having trouble sticking to your diet, talk to your doctor or dietitian about it. You might get some tips that will help you stick to the DASH diet. Remember, healthy eating isn't an  all-or-nothing proposition. What's most important is that, on average, you eat healthier foods with plenty of variety - both to keep your diet nutritious and to  avoid boredom or extremes. And with the DASH diet, you can have both.

## 2017-12-21 NOTE — Addendum Note (Signed)
Addended by: Ayesha MohairWELLS, Fahed Morten E on: 12/21/2017 03:28 PM   Modules accepted: Orders

## 2017-12-27 ENCOUNTER — Telehealth: Payer: Self-pay

## 2017-12-27 ENCOUNTER — Encounter: Payer: Self-pay | Admitting: Family Medicine

## 2017-12-27 ENCOUNTER — Ambulatory Visit (INDEPENDENT_AMBULATORY_CARE_PROVIDER_SITE_OTHER): Payer: Medicaid Other | Admitting: Family Medicine

## 2017-12-27 VITALS — BP 118/82 | HR 80 | Temp 98.6°F | Ht 64.0 in | Wt 206.0 lb

## 2017-12-27 DIAGNOSIS — K5903 Drug induced constipation: Secondary | ICD-10-CM

## 2017-12-27 DIAGNOSIS — T402X5A Adverse effect of other opioids, initial encounter: Secondary | ICD-10-CM

## 2017-12-27 DIAGNOSIS — N3001 Acute cystitis with hematuria: Secondary | ICD-10-CM

## 2017-12-27 DIAGNOSIS — R300 Dysuria: Secondary | ICD-10-CM

## 2017-12-27 LAB — POCT URINALYSIS DIP (DEVICE)
Bilirubin Urine: NEGATIVE
Glucose, UA: NEGATIVE mg/dL
Ketones, ur: NEGATIVE mg/dL
Nitrite: NEGATIVE
PROTEIN: NEGATIVE mg/dL
Specific Gravity, Urine: 1.01 (ref 1.005–1.030)
UROBILINOGEN UA: 0.2 mg/dL (ref 0.0–1.0)
pH: 5.5 (ref 5.0–8.0)

## 2017-12-27 MED ORDER — NALOXEGOL OXALATE 12.5 MG PO TABS: 25.0000 mg | ORAL_TABLET | Freq: Every day | ORAL | Status: DC

## 2017-12-27 MED ORDER — NITROFURANTOIN MONOHYD MACRO 100 MG PO CAPS: 100.0000 mg | ORAL_CAPSULE | Freq: Two times a day (BID) | ORAL | 0 refills | Status: DC

## 2017-12-27 NOTE — Progress Notes (Signed)
Patient ID: Mia [name], female    DOB: 07/22/1976, 42 y.o.   MRN: [id number]  PCP: Bing NeighborsHarris, Allean Montfort S, FNP  Chief Complaint  Patient presents with  . Dysuria  . Constipation    Subjective:  HPI Mia [name] is a 42 y.o. female presents for evaluation of  urinary frequency, urgency and dysuria for over a few days. She denies vaginal discharge, bleeding, chills, fever, or flank pain. She has a second complaint of constipation which has persisted for an extended period of time. 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 She has attempted relief with OTC laxatives which is only occasionally effective in producing a stool.  Social History   Socioeconomic History  . Marital status: Single    Spouse name: Not on file  . Number of children: Not on file  . Years of education: Not on file  . Highest education level: Not on file  Social Needs  . Financial resource strain: Not on file  . Food insecurity - worry: Not on file  . Food insecurity - inability: Not on file  . Transportation needs - medical: Not on file  . Transportation needs - non-medical: Not on file  Occupational History  . Not on file  Tobacco Use  . Smoking status: Current Some Day Smoker    Packs/day: 1.00    Types: Cigarettes  . Smokeless tobacco: Never Used  Substance and Sexual Activity  . Alcohol use: No  . Drug use: No  . Sexual activity: Not on file  Other Topics Concern  . Not on file  Social History Narrative  . Not on file    Family History  Problem Relation Age of Onset  . Other Mother        Denies known hx of cardiovascular, diabetes, or cancer  . Other Father        Denies known hx of cardiovascular, diabetes, or cancer   Review of Systems Pertinent negatives listed in HPI  Patient Active Problem List   Diagnosis Date Noted  . High risk medication use 12/20/2017  . Acute hepatitis C 11/29/2017  . Palpitation 11/08/2017  . First degree heart block  11/07/2017  . Severe recurrent major depression without psychotic features (HCC) 10/22/2017  . Periorbital cellulitis of right eye 09/26/2017  . Periorbital cellulitis 09/26/2017  . Alcohol use disorder, mild, in sustained remission 04/05/2017  . Cocaine use disorder, mild, abuse (HCC) 04/05/2017  . Moderate opioid use disorder (HCC) 04/05/2017  . 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 Tobacco use disorder 04/05/2017  . Suicidal ideation 03/01/2016  . UTI (urinary tract infection) 03/01/2016  . Bipolar 1 disorder, mixed, moderate (HCC)   . Opioid use disorder, moderate, dependence (HCC) 02/28/2016  . 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 Substance induced mood disorder (HCC) 02/28/2016  . Bipolar 1 disorder (HCC) 08/14/2013  . Anxiety state 08/10/2013    No Known Allergies  Prior to Admission medications   Medication Sig Start Date End Date Taking? Authorizing Provider  amitriptyline (ELAVIL) 100 MG tablet Take 100 mg by mouth daily. 12/02/17  Yes [provider]  hydrOXYzine (ATARAX/VISTARIL) 25 MG tablet Take 1 tablet (25 mg total) by mouth every 6 (six) hours as needed for anxiety. 11/21/17  Yes Bing NeighborsHarris, Shaketha Jeon S, FNP  methadone (METHADOSE) 40 MG disintegrating tablet Take 40 mg by mouth daily.   Yes [provider]  QUEtiapine (SEROQUEL) 100 MG tablet  Take 100 mg by mouth at bedtime.   Yes [provider]  traZODone (DESYREL) 50 MG tablet TK 1/2 TO 1 T PO TID DURING THE DAY PRA 12/02/17  Yes [provider]  ziprasidone (GEODON) 60 MG capsule TK 1 C PO Q EVENING WITH FOOD 12/02/17  Yes [provider]    Past Medical, Surgical Family and Social History reviewed and updated.    Objective:   Today's Vitals   12/27/17 1145  BP: 118/82  Pulse: 80  Temp: 98.6 F (37 C)  TempSrc: Oral  SpO2: 98%  Weight: 206 lb (93.4 kg)  Height: 5\' 4"  (1.626 m)    Wt Readings  from Last 3 Encounters:  12/27/17 206 lb (93.4 kg)  12/21/17 206 lb 1.9 oz (93.5 kg)  12/05/17 201 lb (91.2 kg)    Physical Exam  Constitutional: She appears well-developed and well-nourished.  HENT:  Head: Normocephalic and atraumatic.  Cardiovascular: Normal rate, normal heart sounds and intact distal pulses.  Pulmonary/Chest: Effort normal and breath sounds normal.  Abdominal: Soft. There is no CVA tenderness.  Skin: Skin is warm and dry.  Psychiatric: She has a normal mood and affect. Her behavior is normal. Judgment and thought content normal.   Assessment & Plan:  1. Acute cystitis with hematuria, Urine Culture pending. Will treat empirically with nitrofurantoin, (MACROBID) 100 MG capsule; Take 1 capsule (100 mg total) by mouth 2 (two) times daily.  Dispense: 14 capsule.  2. Constipation due to opioid therapy, will trial a course Movantik for management of OIC.   RTC: as needed   Godfrey Pick. Tiburcio Pea, MSN, FNP-C [facility]  [address] [place], [place] [address] [phone]

## 2017-12-27 NOTE — Patient Instructions (Signed)
Please take the prescribed antibiotic for the full prescribed dose Please start taking the movantik for constipation     Urinary Tract Infection, Adult A urinary tract infection (UTI) is an infection of any part of the urinary tract. The urinary tract includes the:  Kidneys.  Ureters.  Bladder.  Urethra.  These organs make, store, and get rid of pee (urine) in the body. Follow these instructions at home:  Take over-the-counter and prescription medicines only as told by your doctor.  If you were prescribed an antibiotic medicine, take it as told by your doctor. Do not stop taking the antibiotic even if you start to feel better.  Avoid the following drinks: ? Alcohol. ? Caffeine. ? Tea. ? Carbonated drinks.  Drink enough fluid to keep your pee clear or pale yellow.  Keep all follow-up visits as told by your doctor. This is important.  Make sure to: ? Empty your bladder often and completely. Do not to hold pee for long periods of time. ? Empty your bladder before and after sex. ? Wipe from front to back after a bowel movement if you are female. Use each tissue one time when you wipe. Contact a doctor if:  You have back pain.  You have a fever.  You feel sick to your stomach (nauseous).  You throw up (vomit).  Your symptoms do not get better after 3 days.  Your symptoms go away and then come back. Get help right away if:  You have very bad back pain.  You have very bad lower belly (abdominal) pain.  You are throwing up and cannot keep down any medicines or water. This information is not intended to replace advice given to you by your health care provider. Make sure you discuss any questions you have with your health care provider. Document Released: 03/22/2008 Document Revised: 03/11/2016 Document Reviewed: 08/25/2015 Elsevier Interactive Patient Education  [employer]2018 Elsevier Inc.

## 2017-12-29 LAB — URINE CULTURE: Organism ID, Bacteria: NO GROWTH

## 2017-12-30 ENCOUNTER — Telehealth: Payer: Self-pay

## 2017-12-30 MED ORDER — NALOXEGOL OXALATE 25 MG PO TABS: 25.0000 mg | ORAL_TABLET | Freq: Every day | ORAL | 1 refills | Status: DC

## 2017-12-30 MED ORDER — NALOXEGOL OXALATE 25 MG PO TABS: 25.0000 mg | ORAL_TABLET | Freq: Every day | ORAL | 1 refills | Status: AC

## 2017-12-30 NOTE — Telephone Encounter (Signed)
[facility] [facility]

## 2018-01-04 ENCOUNTER — Ambulatory Visit: Payer: Self-pay | Admitting: Family Medicine

## 2018-02-22 ENCOUNTER — Telehealth: Payer: Self-pay | Admitting: Behavioral Health

## 2018-02-22 NOTE — Telephone Encounter (Signed)
Patient called stating she received a phone call but no one left a message. Informed her she had an upcoming appointment 02/27/2018 at 3:45.  Patient verbalized understanding. Angeline Slim RN

## 2018-02-27 ENCOUNTER — Encounter: Payer: Self-pay | Admitting: Internal Medicine

## 2018-02-27 ENCOUNTER — Ambulatory Visit (INDEPENDENT_AMBULATORY_CARE_PROVIDER_SITE_OTHER): Payer: Medicaid Other | Admitting: Internal Medicine

## 2018-02-27 VITALS — BP 155/100 | HR 103 | Temp 97.7°F | Ht 64.0 in | Wt 238.0 lb

## 2018-02-27 DIAGNOSIS — B1710 Acute hepatitis C without hepatic coma: Secondary | ICD-10-CM

## 2018-02-27 DIAGNOSIS — F319 Bipolar disorder, unspecified: Secondary | ICD-10-CM | POA: Diagnosis not present

## 2018-02-27 NOTE — Assessment & Plan Note (Signed)
On new medications and taking, some weight gain.

## 2018-02-27 NOTE — Assessment & Plan Note (Signed)
RNA today and if positive she will come back to complete the rest of the labs and get scheduled for an ultrasound.

## 2018-02-27 NOTE — Progress Notes (Signed)
   Subjective:    Patient ID: Mia [name], female    DOB: [birth year], 42 y.o.   MRN: [id number]  HPI Here for follow up of acute hepatitis C. I saw her in February after a recent diagnosis and she had previously about 2 years prior to had been negative.  She continues to be drug free.  Her HCV RNA was 340 in February.  No new issues.    Review of Systems  Constitutional: Negative for fatigue and unexpected weight change.  Gastrointestinal: Negative for diarrhea.  Skin: Negative for rash.  Neurological: Negative for dizziness.       Objective:   Physical Exam  Constitutional: She appears well-developed and well-nourished. No distress.  Eyes: No scleral icterus.  Cardiovascular: Normal rate, regular rhythm and normal heart sounds.  No murmur heard. Pulmonary/Chest: Effort normal and breath sounds normal. No respiratory distress.  Skin: No rash noted.   SH remains drug-free       Assessment & Plan:

## 2018-03-02 ENCOUNTER — Other Ambulatory Visit: Payer: Self-pay | Admitting: Internal Medicine

## 2018-03-02 ENCOUNTER — Telehealth: Payer: Self-pay | Admitting: *Deleted

## 2018-03-02 DIAGNOSIS — B182 Chronic viral hepatitis C: Secondary | ICD-10-CM

## 2018-03-02 LAB — HEPATITIS C GENOTYPE: HCV Genotype: 3

## 2018-03-02 LAB — HEPATITIS C RNA QUANTITATIVE
HCV QUANT LOG: 3 {Log_IU}/mL — AB
HCV RNA, PCR, QN: 996 [IU]/mL — AB

## 2018-03-02 NOTE — Telephone Encounter (Signed)
Thanks.  Labs and ultrasound are in.  No elastography needed since she is recently infected.

## 2018-03-02 NOTE — Telephone Encounter (Signed)
Thanks

## 2018-03-02 NOTE — Telephone Encounter (Signed)
Patient called for lab results. Her Hepatitis C viral load is 996, so per Dr Ephriam Knuckles note, patient needs to come back for additional lab work and will need abdominal ultrasound. RN relayed this to patient, scheduled her for labs on 5/29.   Please add lab/ultrasound orders. Andree Coss, RN

## 2018-03-09 ENCOUNTER — Other Ambulatory Visit: Payer: Self-pay | Admitting: Family Medicine

## 2018-03-15 ENCOUNTER — Other Ambulatory Visit: Payer: Medicaid Other

## 2018-03-15 DIAGNOSIS — B182 Chronic viral hepatitis C: Secondary | ICD-10-CM

## 2018-03-16 LAB — HEPATITIS B SURFACE ANTIGEN: Hepatitis B Surface Ag: NONREACTIVE

## 2018-03-16 LAB — COMPLETE METABOLIC PANEL WITH GFR
AG RATIO: 1.2 (calc) (ref 1.0–2.5)
ALT: 75 U/L — ABNORMAL HIGH (ref 6–29)
AST: 49 U/L — ABNORMAL HIGH (ref 10–30)
Albumin: 4.2 g/dL (ref 3.6–5.1)
Alkaline phosphatase (APISO): 95 U/L (ref 33–115)
BILIRUBIN TOTAL: 0.4 mg/dL (ref 0.2–1.2)
BUN: 14 mg/dL (ref 7–25)
CALCIUM: 9.2 mg/dL (ref 8.6–10.2)
CHLORIDE: 100 mmol/L (ref 98–110)
CO2: 25 mmol/L (ref 20–32)
Creat: 0.69 mg/dL (ref 0.50–1.10)
GFR, EST AFRICAN AMERICAN: 126 mL/min/{1.73_m2} (ref >=60)
GFR, EST NON AFRICAN AMERICAN: 109 mL/min/{1.73_m2} (ref >=60)
Globulin: 3.4 g/dL (calc) (ref 1.9–3.7)
Glucose, Bld: 87 mg/dL (ref 65–99)
POTASSIUM: 4 mmol/L (ref 3.5–5.3)
Sodium: 136 mmol/L (ref 135–146)
TOTAL PROTEIN: 7.6 g/dL (ref 6.1–8.1)

## 2018-03-16 LAB — CBC WITH DIFFERENTIAL/PLATELET
BASOS PCT: 0.8 %
Basophils Absolute: 74 cells/uL (ref 0–200)
EOS ABS: 258 {cells}/uL (ref 15–500)
Eosinophils Relative: 2.8 %
HCT: 40.9 % (ref 35.0–45.0)
HEMOGLOBIN: 14.7 g/dL (ref 11.7–15.5)
LYMPHS ABS: 3533 {cells}/uL (ref 850–3900)
MCH: 31.3 pg (ref 27.0–33.0)
MCHC: 35.9 g/dL (ref 32.0–36.0)
MCV: 87 fL (ref 80.0–100.0)
MONOS PCT: 5.4 %
MPV: 10.3 fL (ref 7.5–12.5)
NEUTROS ABS: 4839 {cells}/uL (ref 1500–7800)
Neutrophils Relative %: 52.6 %
Platelets: 265 10*3/uL (ref 140–400)
RBC: 4.7 10*6/uL (ref 3.80–5.10)
RDW: 12.4 % (ref 11.0–15.0)
Total Lymphocyte: 38.4 %
WBC: 9.2 10*3/uL (ref 3.8–10.8)
WBCMIX: 497 {cells}/uL (ref 200–950)

## 2018-03-16 LAB — HEPATITIS A ANTIBODY, TOTAL: HEPATITIS A AB,TOTAL: REACTIVE — AB

## 2018-03-16 LAB — HIV ANTIBODY (ROUTINE TESTING W REFLEX): HIV 1&2 Ab, 4th Generation: NONREACTIVE

## 2018-03-16 LAB — HEPATITIS B CORE ANTIBODY, TOTAL: Hep B Core Total Ab: NONREACTIVE

## 2018-03-16 LAB — PROTIME-INR
INR: 1
Prothrombin Time: 10.1 s (ref 9.0–11.5)

## 2018-03-16 LAB — HEPATITIS B SURFACE ANTIBODY,QUALITATIVE: Hep B S Ab: REACTIVE — AB

## 2018-03-23 ENCOUNTER — Ambulatory Visit
Admission: RE | Admit: 2018-03-23 | Discharge: 2018-03-23 | Disposition: A | Discharge status: Home / Self Care | Payer: Medicaid Other | Source: Ambulatory Visit | Attending: Internal Medicine | Admitting: Internal Medicine

## 2018-03-29 ENCOUNTER — Other Ambulatory Visit: Payer: Self-pay | Admitting: Pharmacist

## 2018-03-29 DIAGNOSIS — B182 Chronic viral hepatitis C: Secondary | ICD-10-CM

## 2018-03-29 MED ORDER — GLECAPREVIR-PIBRENTASVIR 100-40 MG PO TABS: 3.0000 | ORAL_TABLET | Freq: Every day | ORAL | 1 refills | Status: AC

## 2018-03-30 MED FILL — MAVYRET 100-40 MG TABS: 100-40 | 28 days supply | Qty: 84 | Fill #0

## 2018-04-04 ENCOUNTER — Encounter: Payer: Self-pay | Admitting: Pharmacy Technician

## 2018-04-04 ENCOUNTER — Ambulatory Visit (INDEPENDENT_AMBULATORY_CARE_PROVIDER_SITE_OTHER): Payer: Medicaid Other | Admitting: Pharmacist

## 2018-04-04 ENCOUNTER — Ambulatory Visit: Payer: Medicaid Other | Admitting: Pharmacy Technician

## 2018-04-04 DIAGNOSIS — B182 Chronic viral hepatitis C: Secondary | ICD-10-CM

## 2018-04-04 DIAGNOSIS — Z7189 Other specified counseling: Secondary | ICD-10-CM

## 2018-04-04 NOTE — Progress Notes (Signed)
HPI: Mia [name] is a 42 y.o. female who presents to the [facility] pharmacy clinic to pick up her first 28 days of Mavyret therapy, requesting pharmacist counseling.  Lab Results  Component Value Date   HCVGENOTYPE 3 02/27/2018   Past Medical History: Past Medical History:  Diagnosis Date  . Alcohol use disorder, mild, in sustained remission 04/05/2017  . Anxiety   . Anxiety state 08/10/2013   Overview:  Updated invalid ICD 9 codes for ICD 10 go live  . Bipolar 1 disorder (HCC)   . Bipolar 1 disorder, mixed, moderate (HCC)   . Cocaine use disorder, mild, abuse (HCC) 04/05/2017  . Cystic acne   . First degree heart block 11/07/2017  . 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 Moderate opioid use disorder (HCC) 04/05/2017  . Opioid use disorder, moderate, dependence (HCC) 02/28/2016  . Periorbital cellulitis 09/26/2017  . Periorbital cellulitis of right eye 09/26/2017  . 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 Severe recurrent major depression without psychotic features (HCC) 10/22/2017  . Substance induced mood disorder (HCC) 02/28/2016  . Suicidal ideation 03/01/2016  . Tobacco use disorder 04/05/2017  . UTI (urinary tract infection) 03/01/2016   Social History: Social History   Socioeconomic History  . Marital status: Single    Spouse name: Not on file  . Number of children: Not on file  . Years of education: Not on file  . Highest education level: Not on file  Occupational History  . Not on file  Social Needs  . Financial resource strain: Not on file  . Food insecurity:    Worry: Not on file    Inability: Not on file  . Transportation needs:    Medical: Not on file    Non-medical: Not on file  Tobacco Use  . Smoking status: Current Some Day Smoker    Packs/day: 1.00    Types: Cigarettes  . Smokeless tobacco: Never Used  Substance and Sexual Activity  . Alcohol use: No  . Drug use: No  . Sexual activity:  Not on file  Lifestyle  . Physical activity:    Days per week: Not on file    Minutes per session: Not on file  . Stress: Not on file  Relationships  . Social connections:    Talks on phone: Not on file    Gets together: Not on file    Attends religious service: Not on file    Active member of club or organization: Not on file    Attends meetings of clubs or organizations: Not on file    Relationship status: Not on file  Other Topics Concern  . Not on file  Social History Narrative  . Not on file   Labs: Hep B S Ab (no units)  Date Value  03/15/2018 REACTIVE (A)   Hepatitis B Surface Ag (no units)  Date Value  03/15/2018 NON-REACTIVE   HCV Ab (s/co ratio)  Date Value  10/23/2017 >11.0 (H)   Lab Results  Component Value Date   HCVGENOTYPE 3 02/27/2018   Hepatitis C RNA quantitative Latest Ref Rng & Units 02/27/2018 11/29/2017  HCV Quantitative Log NOT DETECT Log IU/mL 3.00(H) 2.53(H)   AST (U/L)  Date Value  03/15/2018 49 (H)  10/21/2017 27  09/28/2017 32   ALT (U/L)  Date Value  03/15/2018 75 (H)  10/21/2017 38  09/28/2017 42   INR (no units)  Date Value  03/15/2018 1.0  Assessment/Counseling: Mia [name] presents to the pharmacy clinic today for Mavyret counseling. She picked up her first 28 days of therapy. She was counseled on the following: Mavyret should be taken at the same time everyday with food, adherence was highly encouraged and the risk of failure of treatment and resistance was educated. She was instructed that her therapy will consist of 8 weeks, and she will receive 28 days of therapy at a time. She was counseled on the risk for fatigue, nausea, and headaches, and was also educated to call us if there is a concerning side effect before self-discontinuing the medication as we may be able to prescribe her a medicine to help. Patient agreed.  She has Medicaid. She will receive her final package of Mavyret next month.    Hershal Coria, Pharm.D.,  MS PGY1 Pharmacy Resident  [facility] 04/04/2018, 10:03 AM

## 2018-04-21 ENCOUNTER — Telehealth: Payer: Self-pay | Admitting: Family Medicine

## 2018-04-21 NOTE — Telephone Encounter (Signed)
Mia [name], a 42 year old female notified oncall services for urinary tract symptoms including hematuria, dysuria, and urgency. Patient advised to report to urgent care or emergency services for further work up and evaluation.  Also, will schedule a first available with primary provider on 04/24/2018.    Mia NationsLachina Moore Hollis  MSN, FNP-C Patient Care [facility] [address]  [place], [place] [id number] [phone]

## 2018-04-22 NOTE — Telephone Encounter (Signed)
Armeniahina,  Received your message.   We will see her on Monday, 04/24/2018.    Karren Burlyhandra,   Please schedule her for appointment on 04/24/2018.  Thank you!

## 2018-04-24 ENCOUNTER — Ambulatory Visit: Payer: Self-pay | Admitting: Family Medicine

## 2018-04-25 ENCOUNTER — Ambulatory Visit: Payer: Self-pay

## 2018-04-25 MED FILL — MAVYRET 100-40 MG TABS: 100-40 | 28 days supply | Qty: 84 | Fill #1

## 2018-04-27 ENCOUNTER — Ambulatory Visit (INDEPENDENT_AMBULATORY_CARE_PROVIDER_SITE_OTHER): Payer: Medicaid Other | Admitting: Pharmacist

## 2018-04-27 DIAGNOSIS — B182 Chronic viral hepatitis C: Secondary | ICD-10-CM

## 2018-04-27 NOTE — Progress Notes (Signed)
HPI: Mia [name] is a 42 y.o. female who [facility] for Hep C follow-up. She has genotype 3 and started 8 weeks of Mavyret on 6/18.  Patient Active Problem List   Diagnosis Date Noted  . High risk medication use 12/20/2017  . Chronic hepatitis C without hepatic coma (HCC) 11/29/2017  . Palpitation 11/08/2017  . First degree heart block 11/07/2017  . Severe recurrent major depression without psychotic features (HCC) 10/22/2017  . Periorbital cellulitis of right eye 09/26/2017  . Periorbital cellulitis 09/26/2017  . Alcohol use disorder, mild, in sustained remission 04/05/2017  . Cocaine use disorder, mild, abuse (HCC) 04/05/2017  . Moderate opioid use disorder (HCC) 04/05/2017  . 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 Tobacco use disorder 04/05/2017  . Suicidal ideation 03/01/2016  . Bipolar 1 disorder, mixed, moderate (HCC)   . Opioid use disorder, moderate, dependence (HCC) 02/28/2016  . 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 Substance induced mood disorder (HCC) 02/28/2016  . Bipolar 1 disorder (HCC) 08/14/2013  . Anxiety state 08/10/2013    Patient's Medications  New Prescriptions   No medications on file  Previous Medications   AMITRIPTYLINE (ELAVIL) 100 MG TABLET    Take 100 mg by mouth daily.   GLECAPREVIR-PIBRENTASVIR (MAVYRET) 100-40 MG TABS    Take 3 tablets by mouth daily with breakfast.   HYDROXYZINE (ATARAX/VISTARIL) 25 MG TABLET    TAKE 1 TABLET BY MOUTH EVERY 6 HOURS AS NEEDED FOR ANXIETY   METHADONE (METHADOSE) 40 MG DISINTEGRATING TABLET    Take 40 mg by mouth daily.   NALOXEGOL OXALATE (MOVANTIK) 25 MG TABS TABLET    Take 1 tablet (25 mg total) by mouth daily.   NITROFURANTOIN, MACROCRYSTAL-MONOHYDRATE, (MACROBID) 100 MG CAPSULE    Take 1 capsule (100 mg total) by mouth 2 (two) times daily.   QUETIAPINE (SEROQUEL) 100 MG TABLET    Take 100 mg by mouth at  bedtime.   TRAZODONE (DESYREL) 50 MG TABLET    TK 1/2 TO 1 T PO TID DURING THE DAY PRA   ZIPRASIDONE (GEODON) 60 MG CAPSULE    TK 1 C PO Q EVENING WITH FOOD  Modified Medications   No medications on file  Discontinued Medications   No medications on file    Allergies: No Known Allergies  Past Medical History: Past Medical History:  Diagnosis Date  . Alcohol use disorder, mild, in sustained remission 04/05/2017  . Anxiety   . Anxiety state 08/10/2013   Overview:  Updated invalid ICD 9 codes for ICD 10 go live  . Bipolar 1 disorder (HCC)   . Bipolar 1 disorder, mixed, moderate (HCC)   . Cocaine use disorder, mild, abuse (HCC) 04/05/2017  . Cystic acne   . First degree heart block 11/07/2017  . 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 Moderate opioid use disorder (HCC) 04/05/2017  . Opioid use disorder, moderate, dependence (HCC) 02/28/2016  . Periorbital cellulitis 09/26/2017  . Periorbital cellulitis of right eye 09/26/2017  . 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 Severe recurrent major depression without psychotic features (HCC) 10/22/2017  . Substance induced mood disorder (HCC) 02/28/2016  . Suicidal ideation 03/01/2016  . Tobacco use disorder 04/05/2017  . UTI (urinary tract infection) 03/01/2016    Social History: Social History   Socioeconomic History  . Marital status: Single    Spouse name: Not on file  .  Number of children: Not on file  . Years of education: Not on file  . Highest education level: Not on file  Occupational History  . Not on file  Social Needs  . Financial resource strain: Not on file  . Food insecurity:    Worry: Not on file    Inability: Not on file  . Transportation needs:    Medical: Not on file    Non-medical: Not on file  Tobacco Use  . Smoking status: Current Some Day Smoker    Packs/day: 1.00    Types: Cigarettes  . Smokeless tobacco: Never Used  Substance and Sexual  Activity  . Alcohol use: No  . Drug use: No  . Sexual activity: Not on file  Lifestyle  . Physical activity:    Days per week: Not on file    Minutes per session: Not on file  . Stress: Not on file  Relationships  . Social connections:    Talks on phone: Not on file    Gets together: Not on file    Attends religious service: Not on file    Active member of club or organization: Not on file    Attends meetings of clubs or organizations: Not on file    Relationship status: Not on file  Other Topics Concern  . Not on file  Social History Narrative  . Not on file    Labs: Hepatitis C Lab Results  Component Value Date   HCVGENOTYPE 3 02/27/2018   HCVRNAPCRQN 996 (H) 02/27/2018   HCVRNAPCRQN 340 (H) 11/29/2017   Hepatitis B Lab Results  Component Value Date   HEPBSAB REACTIVE (A) 03/15/2018   HEPBSAG NON-REACTIVE 03/15/2018   HEPBCAB NON-REACTIVE 03/15/2018   Hepatitis A Lab Results  Component Value Date   HAV REACTIVE (A) 03/15/2018   HIV Lab Results  Component Value Date   HIV NON-REACTIVE 03/15/2018   HIV Non Reactive 10/23/2017   HIV Non Reactive 09/27/2017   Lab Results  Component Value Date   CREATININE 0.69 03/15/2018   CREATININE 0.71 11/21/2017   CREATININE 0.90 10/22/2017   CREATININE 0.73 10/21/2017   CREATININE 0.59 09/30/2017   Lab Results  Component Value Date   AST 49 (H) 03/15/2018   AST 27 10/21/2017   AST 32 09/28/2017   ALT 75 (H) 03/15/2018   ALT 38 10/21/2017   ALT 42 09/28/2017   INR 1.0 03/15/2018    Fibrosis Score: Non-cirrhotic   Assessment: Mia [name] is here today to follow-up for her Hepatitis C infection.  She recently started 8 weeks of Mavyret last month on 6/18. She comes in today to pick up her 2nd and final month.  She did miss one dose the 2nd week of therapy but no other missed doses besides that one.  No side effects or intolerances - no headaches, diarrhea, nausea, fatigue, or vomiting. She takes it between 4-5pm  every evening. Encouraged continued compliance and warned not to miss any more doses.  She does take all three pills together with food. I will check another Hep C viral load and a CMET today and see her back when she has completed treatment.   Plan: - Continue Mavyret x 8 weeks - Hep C VL and CMET today - F/u with me again 8/27 at 315pm  Cassie L. Kuppelweiser, PharmD, AAHIVP, CPP Infectious Diseases Clinical Pharmacist [facility] 04/27/2018, 3:31 PM

## 2018-04-28 LAB — COMPREHENSIVE METABOLIC PANEL
AG RATIO: 1.1 (calc) (ref 1.0–2.5)
ALBUMIN MSPROF: 3.8 g/dL (ref 3.6–5.1)
ALT: 21 U/L (ref 6–29)
AST: 19 U/L (ref 10–30)
Alkaline phosphatase (APISO): 119 U/L — ABNORMAL HIGH (ref 33–115)
BUN: 7 mg/dL (ref 7–25)
CHLORIDE: 105 mmol/L (ref 98–110)
CO2: 26 mmol/L (ref 20–32)
CREATININE: 0.73 mg/dL (ref 0.50–1.10)
Calcium: 9.1 mg/dL (ref 8.6–10.2)
GLOBULIN: 3.4 g/dL (ref 1.9–3.7)
GLUCOSE: 100 mg/dL — AB (ref 65–99)
POTASSIUM: 4.4 mmol/L (ref 3.5–5.3)
Sodium: 137 mmol/L (ref 135–146)
Total Bilirubin: 0.5 mg/dL (ref 0.2–1.2)
Total Protein: 7.2 g/dL (ref 6.1–8.1)

## 2018-04-30 LAB — HEPATITIS C RNA QUANTITATIVE
HCV QUANT LOG: NOT DETECTED {Log_IU}/mL
HCV RNA, PCR, QN: NOT DETECTED [IU]/mL

## 2018-05-02 ENCOUNTER — Ambulatory Visit: Payer: Self-pay

## 2018-05-05 ENCOUNTER — Telehealth: Payer: Self-pay | Admitting: *Deleted

## 2018-05-05 NOTE — Telephone Encounter (Signed)
Patietn called for her lab results.  RN relayed results. Andree CossHowell, Kimley Apsey M, RN

## 2018-06-05 ENCOUNTER — Ambulatory Visit (INDEPENDENT_AMBULATORY_CARE_PROVIDER_SITE_OTHER): Payer: Medicaid Other | Admitting: Family Medicine

## 2018-06-05 ENCOUNTER — Encounter: Payer: Self-pay | Admitting: Family Medicine

## 2018-06-05 ENCOUNTER — Telehealth: Payer: Self-pay

## 2018-06-05 VITALS — BP 116/76 | HR 70 | Temp 98.0°F | Ht 64.0 in | Wt 216.0 lb

## 2018-06-05 DIAGNOSIS — R609 Edema, unspecified: Secondary | ICD-10-CM | POA: Diagnosis not present

## 2018-06-05 DIAGNOSIS — Z09 Encounter for follow-up examination after completed treatment for conditions other than malignant neoplasm: Secondary | ICD-10-CM

## 2018-06-05 DIAGNOSIS — R8290 Unspecified abnormal findings in urine: Secondary | ICD-10-CM | POA: Diagnosis not present

## 2018-06-05 DIAGNOSIS — E6609 Other obesity due to excess calories: Secondary | ICD-10-CM

## 2018-06-05 DIAGNOSIS — L03211 Cellulitis of face: Secondary | ICD-10-CM

## 2018-06-05 DIAGNOSIS — N898 Other specified noninflammatory disorders of vagina: Secondary | ICD-10-CM | POA: Diagnosis not present

## 2018-06-05 DIAGNOSIS — Z131 Encounter for screening for diabetes mellitus: Secondary | ICD-10-CM

## 2018-06-05 DIAGNOSIS — Z6837 Body mass index (BMI) 37.0-37.9, adult: Secondary | ICD-10-CM

## 2018-06-05 LAB — POCT URINALYSIS DIP (MANUAL ENTRY)
Glucose, UA: NEGATIVE mg/dL
Ketones, POC UA: NEGATIVE mg/dL
Nitrite, UA: NEGATIVE
Protein Ur, POC: 30 mg/dL — AB
Spec Grav, UA: 1.01 (ref 1.010–1.025)
Urobilinogen, UA: 0.2 E.U./dL
pH, UA: 6.5 (ref 5.0–8.0)

## 2018-06-05 NOTE — Patient Instructions (Signed)
Hydrochlorothiazide, HCTZ capsules or tablets What is this medicine? HYDROCHLOROTHIAZIDE (hye droe klor oh THYE a zide) is a diuretic. It increases the amount of urine passed, which causes the body to lose salt and water. This medicine is used to treat high blood pressure. It is also reduces the swelling and water retention caused by various medical conditions, such as heart, liver, or kidney disease. This medicine may be used for other purposes; ask your health care provider or pharmacist if you have questions. COMMON BRAND NAME(S): Esidrix, Ezide, HydroDIURIL, Microzide, Oretic, Zide What should I tell my health care provider before I take this medicine? They need to know if you have any of these conditions: -diabetes -gout -immune system problems, like lupus -kidney disease or kidney stones -liver disease -pancreatitis -small amount of urine or difficulty passing urine -an unusual or allergic reaction to hydrochlorothiazide, sulfa drugs, other medicines, foods, dyes, or preservatives -pregnant or trying to get pregnant -breast-feeding How should I use this medicine? Take this medicine by mouth with a glass of water. Follow the directions on the prescription label. Take your medicine at regular intervals. Remember that you will need to pass urine frequently after taking this medicine. Do not take your doses at a time of day that will cause you problems. Do not stop taking your medicine unless your doctor tells you to. Talk to your pediatrician regarding the use of this medicine in children. Special care may be needed. Overdosage: If you think you have taken too much of this medicine contact a poison control center or emergency room at once. NOTE: This medicine is only for you. Do not share this medicine with others. What if I miss a dose? If you miss a dose, take it as soon as you can. If it is almost time for your next dose, take only that dose. Do not take double or extra doses. What may  interact with this medicine? -cholestyramine -colestipol -digoxin -dofetilide -lithium -medicines for blood pressure -medicines for diabetes -medicines that relax muscles for surgery -other diuretics -steroid medicines like prednisone or cortisone This list may not describe all possible interactions. Give your health care provider a list of all the medicines, herbs, non-prescription drugs, or dietary supplements you use. Also tell them if you smoke, drink alcohol, or use illegal drugs. Some items may interact with your medicine. What should I watch for while using this medicine? Visit your doctor or health care professional for regular checks on your progress. Check your blood pressure as directed. Ask your doctor or health care professional what your blood pressure should be and when you should contact him or her. You may need to be on a special diet while taking this medicine. Ask your doctor. Check with your doctor or health care professional if you get an attack of severe diarrhea, nausea and vomiting, or if you sweat a lot. The loss of too much body fluid can make it dangerous for you to take this medicine. You may get drowsy or dizzy. Do not drive, use machinery, or do anything that needs mental alertness until you know how this medicine affects you. Do not stand or sit up quickly, especially if you are an older patient. This reduces the risk of dizzy or fainting spells. Alcohol may interfere with the effect of this medicine. Avoid alcoholic drinks. This medicine may affect your blood sugar level. If you have diabetes, check with your doctor or health care professional before changing the dose of your diabetic medicine. This medicine  can make you more sensitive to the sun. Keep out of the sun. If you cannot avoid being in the sun, wear protective clothing and use sunscreen. Do not use sun lamps or tanning beds/booths. What side effects may I notice from receiving this medicine? Side effects  that you should report to your doctor or health care professional as soon as possible: -allergic reactions such as skin rash or itching, hives, swelling of the lips, mouth, tongue, or throat -changes in vision -chest pain -eye pain -fast or irregular heartbeat -feeling faint or lightheaded, falls -gout attack -muscle pain or cramps -pain or difficulty when passing urine -pain, tingling, numbness in the hands or feet -redness, blistering, peeling or loosening of the skin, including inside the mouth -unusually weak or tired Side effects that usually do not require medical attention (report to your doctor or health care professional if they continue or are bothersome): -change in sex drive or performance -dry mouth -headache -stomach upset This list may not describe all possible side effects. Call your doctor for medical advice about side effects. You may report side effects to FDA at 1-800-FDA-1088. Where should I keep my medicine? Keep out of the reach of children. Store at room temperature between 15 and 30 degrees C (59 and 86 degrees F). Do not freeze. Protect from light and moisture. Keep container closed tightly. Throw away any unused medicine after the expiration date. NOTE: This sheet is a summary. It may not cover all possible information. If you have questions about this medicine, talk to your doctor, pharmacist, or health care provider.  2018 Elsevier/Gold Standard (2010-05-29 12:57:37) Cephalexin tablets or capsules What is this medicine? CEPHALEXIN (sef a LEX in) is a cephalosporin antibiotic. It is used to treat certain kinds of bacterial infections It will not work for colds, flu, or other viral infections. This medicine may be used for other purposes; ask your health care provider or pharmacist if you have questions. COMMON BRAND NAME(S): Biocef, Daxbia, Keflex, Keftab What should I tell my health care provider before I take this medicine? They need to know if you have  any of these conditions: -kidney disease -stomach or intestine problems, especially colitis -an unusual or allergic reaction to cephalexin, other cephalosporins, penicillins, other antibiotics, medicines, foods, dyes or preservatives -pregnant or trying to get pregnant -breast-feeding How should I use this medicine? Take this medicine by mouth with a full glass of water. Follow the directions on the prescription label. This medicine can be taken with or without food. Take your medicine at regular intervals. Do not take your medicine more often than directed. Take all of your medicine as directed even if you think you are better. Do not skip doses or stop your medicine early. Talk to your pediatrician regarding the use of this medicine in children. While this drug may be prescribed for selected conditions, precautions do apply. Overdosage: If you think you have taken too much of this medicine contact a poison control center or emergency room at once. NOTE: This medicine is only for you. Do not share this medicine with others. What if I miss a dose? If you miss a dose, take it as soon as you can. If it is almost time for your next dose, take only that dose. Do not take double or extra doses. There should be at least 4 to 6 hours between doses. What may interact with this medicine? -probenecid -some other antibiotics This list may not describe all possible interactions. Give your health care  provider a list of all the medicines, herbs, non-prescription drugs, or dietary supplements you use. Also tell them if you smoke, drink alcohol, or use illegal drugs. Some items may interact with your medicine. What should I watch for while using this medicine? Tell your doctor or health care professional if your symptoms do not begin to improve in a few days. Do not treat diarrhea with over the counter products. Contact your doctor if you have diarrhea that lasts more than 2 days or if it is severe and  watery. If you have diabetes, you may get a false-positive result for sugar in your urine. Check with your doctor or health care professional. What side effects may I notice from receiving this medicine? Side effects that you should report to your doctor or health care professional as soon as possible: -allergic reactions like skin rash, itching or hives, swelling of the face, lips, or tongue -breathing problems -pain or trouble passing urine -redness, blistering, peeling or loosening of the skin, including inside the mouth -severe or watery diarrhea -unusually weak or tired -yellowing of the eyes, skin Side effects that usually do not require medical attention (report to your doctor or health care professional if they continue or are bothersome): -gas or heartburn -genital or anal irritation -headache -joint or muscle pain -nausea, vomiting This list may not describe all possible side effects. Call your doctor for medical advice about side effects. You may report side effects to FDA at 1-800-FDA-1088. Where should I keep my medicine? Keep out of the reach of children. Store at room temperature between 59 and 86 degrees F (15 and 30 degrees C). Throw away any unused medicine after the expiration date. NOTE: This sheet is a summary. It may not cover all possible information. If you have questions about this medicine, talk to your doctor, pharmacist, or health care provider.  2018 Elsevier/Gold Standard (2008-01-08 17:09:13)

## 2018-06-05 NOTE — Progress Notes (Signed)
Follow Up  Subjective:    Patient ID: Mia [name], female    DOB: 06/10/1976, 42 y.o.   MRN: [id number]   Chief Complaint  Patient presents with  . 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 She is here today for follow up.   Current Status: Since her last office visit, she has had began to have cellulitis skin infections on her face. She has a history of facial cellulitis infections. She has completed her course of antiviral medications for Hepatitis C. She continues to follow up with Infection Disease. She is trying to loose more weight.   She denies fevers, chills, fatigue, recent infections, weight loss, and night sweats.   She has not had any headaches, visual changes, dizziness, and falls.   No chest pain, heart palpitations, cough and shortness of breath reported.   No reports of GI problems such as nausea, vomiting, diarrhea, and constipation. She has no reports of blood in stools, dysuria and hematuria.   No depression or anxiety reported.   She denies pain today.   Review of Systems  Constitutional: Negative.   HENT: Negative.   Eyes: Negative.   Respiratory: Negative.   Cardiovascular: Negative.   Gastrointestinal: Negative.   Endocrine: Negative.   Genitourinary: Negative.   Musculoskeletal: Negative.   Skin:       Cellulitis areas scattered over face.   Bilateral  1+ peripheral edema   Allergic/Immunologic: Negative.   Neurological: Negative.   Hematological: Negative.   Psychiatric/Behavioral: Negative.    Objective:   Physical Exam  Cardiovascular: Normal rate, regular rhythm, normal heart sounds and intact distal pulses.  Pulmonary/Chest: Effort normal and breath sounds normal.  Abdominal: Soft. Bowel sounds are normal. She exhibits distension (Obese).  Musculoskeletal: Normal range of motion.  Skin: Skin is warm and  dry. Capillary refill takes less than 2 seconds.  Psychiatric: She has a normal mood and affect. Her behavior is normal. Judgment and thought content normal.   Assessment & Plan:   1. Cellulitis of face - Ambulatory referral to Dermatology - cephALEXin (KEFLEX) 500 MG capsule; Take 1 capsule (500 mg total) by mouth 3 (three) times daily for 10 days.  Dispense: 30 capsule; Refill: 0  2. Edema, unspecified type Mild lower extremity edema. We will send Rx for HCTZ to pharmacy today. Monitor.  - hydrochlorothiazide (HYDRODIURIL) 25 MG tablet; Take 1 tablet (25 mg total) by mouth daily.  Dispense: 90 tablet; Refill: 1  3. Vaginal discharge - Vaginitis/Vaginosis, DNA Probe - POCT urinalysis dipstick  4. Abnormal urinalysis - Urine Culture  5. Screening for diabetes mellitus  6. Class 2 obesity due to excess calories without serious comorbidity with bodt mass index (BMI) of 37.0 to 37.9 in adult.  BMI is 37.08 today. Her goal BMI is <25. Encouraged efforts to reduce weight include engaging in physical activity as tolerated with goal of 150 minutes per week. Improve dietary choices and eat a meal regimen consistent with a Mediterranean or DASH diet. Reduce simple carbohydrates. Do not skip meals and eat healthy snacks throughout the day to avoid over-eating at dinner. Set a goal weight loss that is achievable for you.  7. Follow up She will follow up in 2 months.   Meds ordered this encounter  Medications  . cephALEXin (KEFLEX) 500 MG capsule    Sig: Take 1 capsule (500 mg total)  by mouth 3 (three) times daily for 10 days.    Dispense:  30 capsule    Refill:  0  . hydrochlorothiazide (HYDRODIURIL) 25 MG tablet    Sig: Take 1 tablet (25 mg total) by mouth daily.    Dispense:  90 tablet    Refill:  1   Raliegh IpNatalie Ellice Boultinghouse,  MSN, FNP-C Patient [facility] [address]  [place], [place] [id number] [phone]

## 2018-06-06 MED ORDER — CEPHALEXIN 500 MG PO CAPS: 500.0000 mg | ORAL_CAPSULE | Freq: Three times a day (TID) | ORAL | 0 refills | Status: AC

## 2018-06-06 MED ORDER — HYDROCHLOROTHIAZIDE 25 MG PO TABS: 25.0000 mg | ORAL_TABLET | Freq: Every day | ORAL | 1 refills | Status: AC

## 2018-06-07 LAB — VAGINITIS/VAGINOSIS, DNA PROBE
Candida Species: NEGATIVE
Gardnerella vaginalis: POSITIVE — AB
Trichomonas vaginosis: NEGATIVE

## 2018-06-07 LAB — URINE CULTURE

## 2018-06-13 ENCOUNTER — Ambulatory Visit: Payer: Self-pay

## 2018-08-07 ENCOUNTER — Ambulatory Visit: Payer: Self-pay | Admitting: Family Medicine

## 2018-08-15 NOTE — Telephone Encounter (Signed)
error 

## 2019-02-22 NOTE — Telephone Encounter (Signed)
Message sent to provider 

## 2020-04-29 IMAGING — US US ABDOMEN COMPLETE
1 series · 14 of 25 positions shown · non-contrast
Comparison: None.

CLINICAL DATA: Chronic hepatitis-C

EXAM:
ABDOMEN ULTRASOUND COMPLETE

[Series 1: us abdomen complete · 0.25mm/px · 14 of 87 slices shown]
[im 1/87]
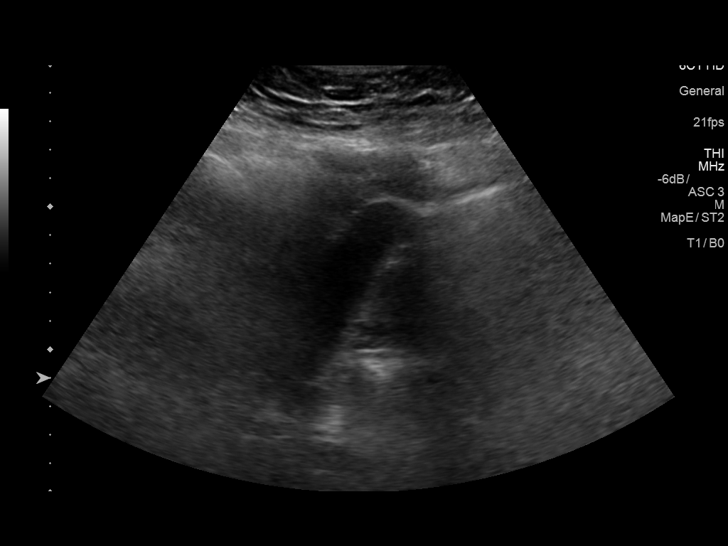
[im 8/87]
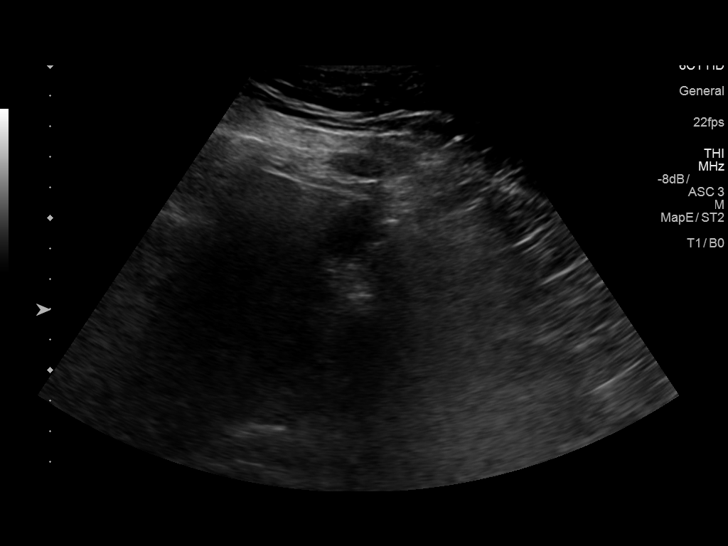
[im 15/87]
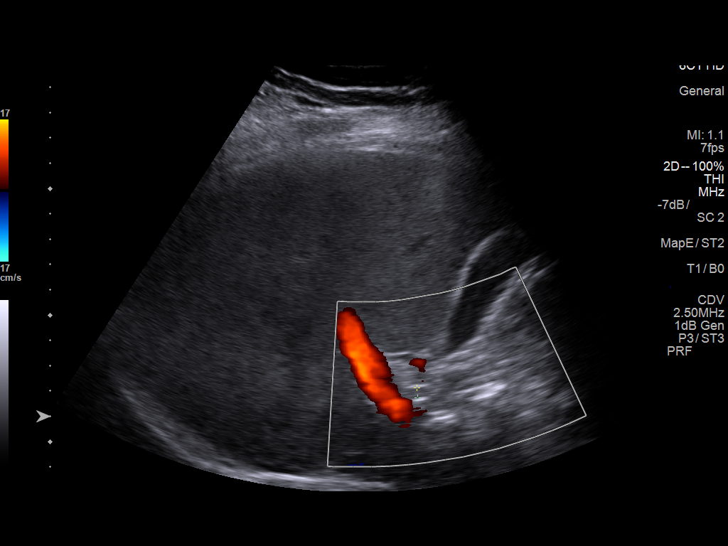
[im 22/87]
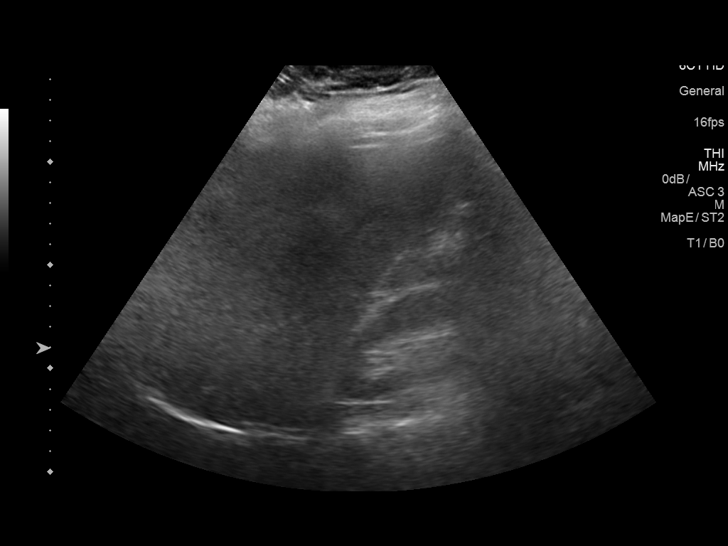
[im 29/87]
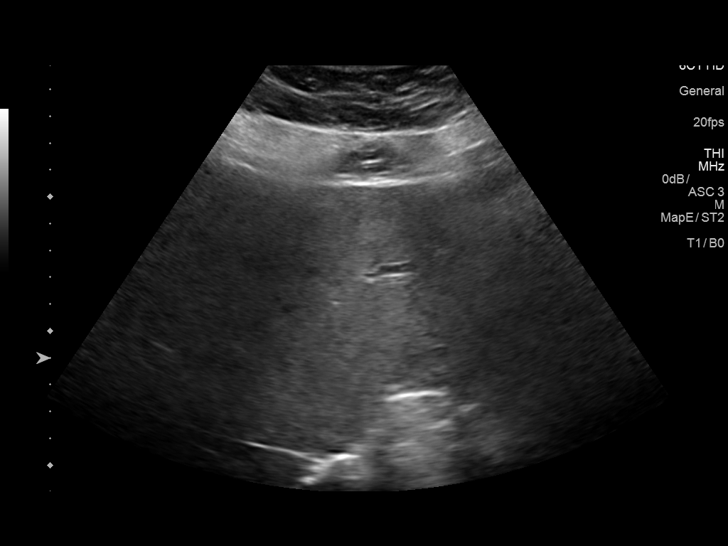
[im 33/87]
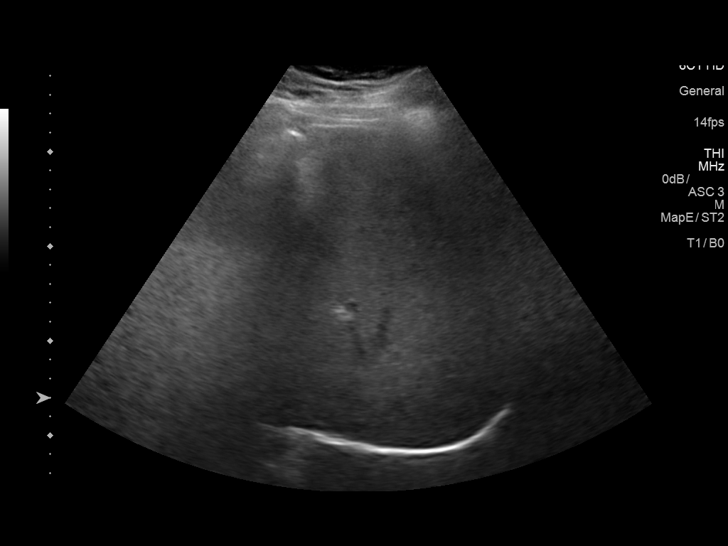
[im 40/87]
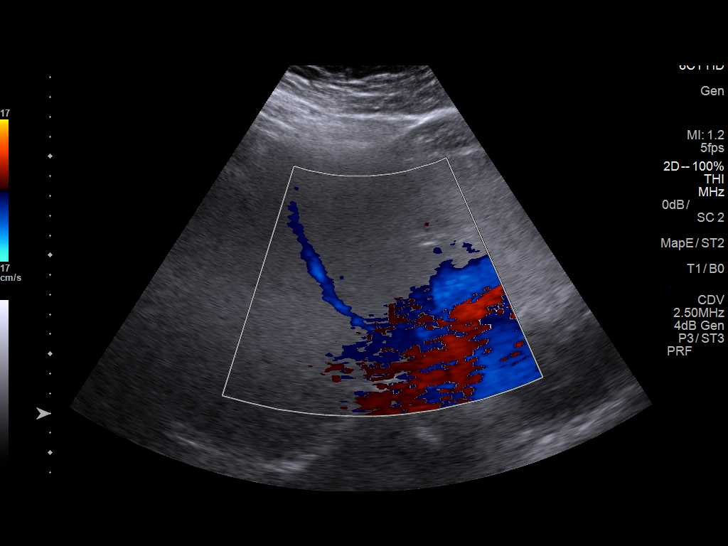
[im 47/87]
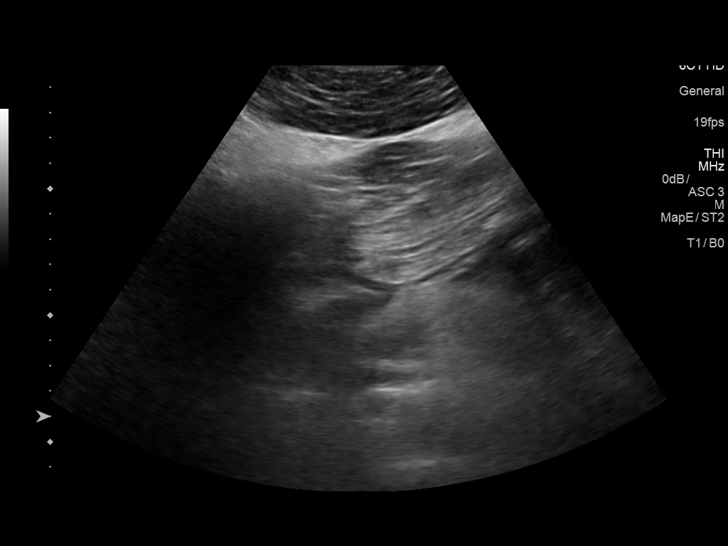
[im 54/87]
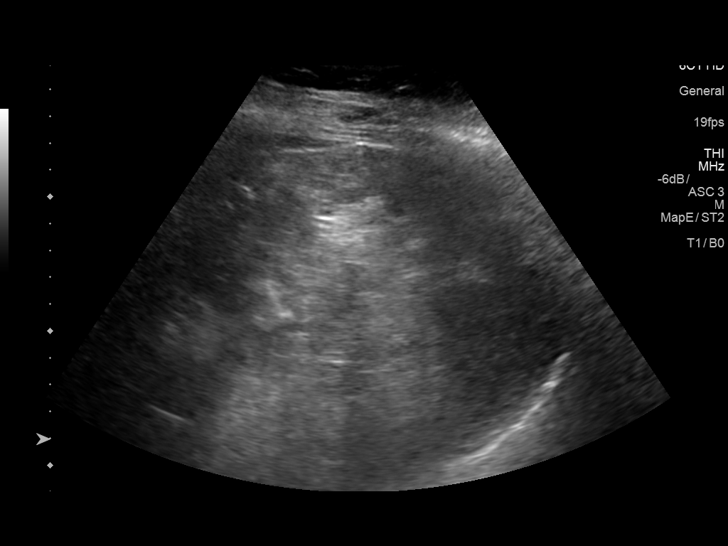
[im 58/87]
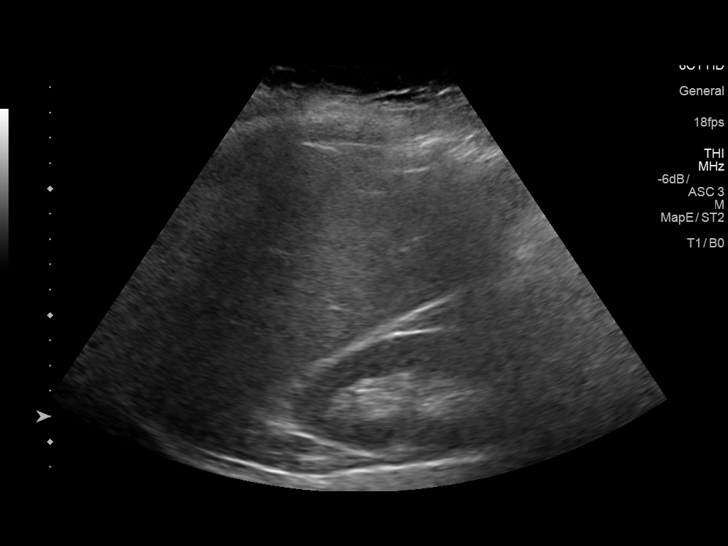
[im 65/87]
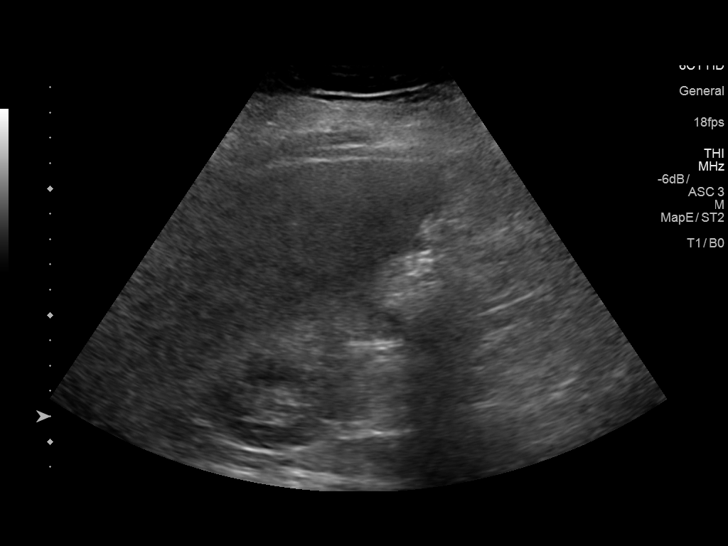
[im 72/87]
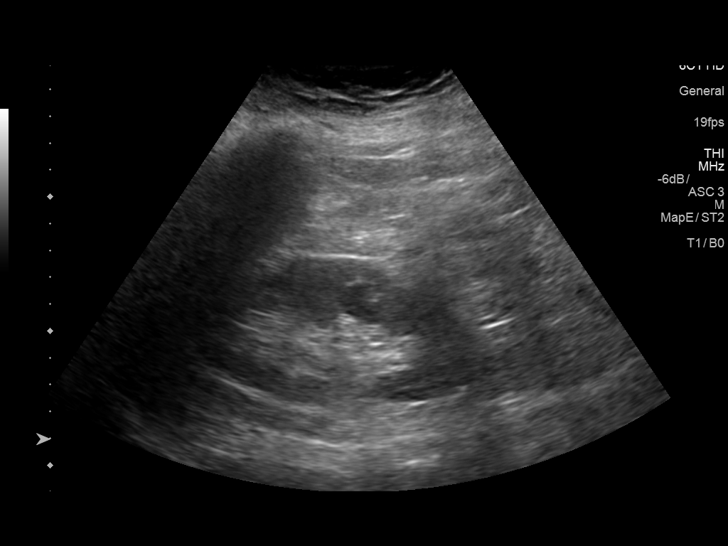
[im 79/87]
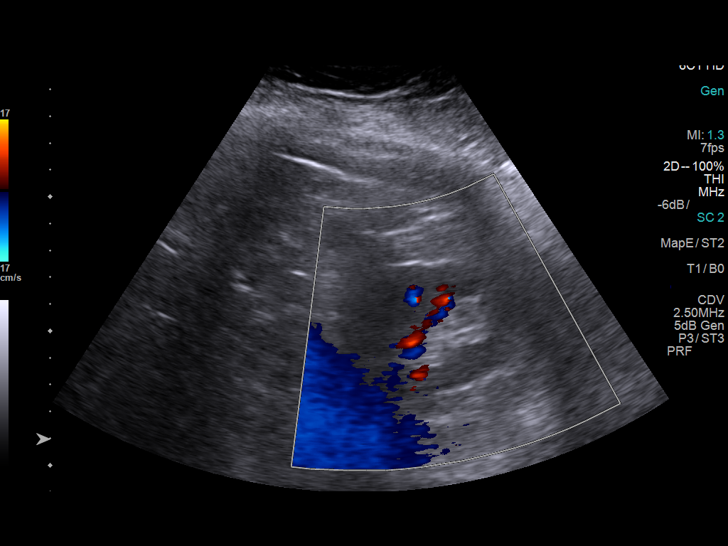
[im 87/87]
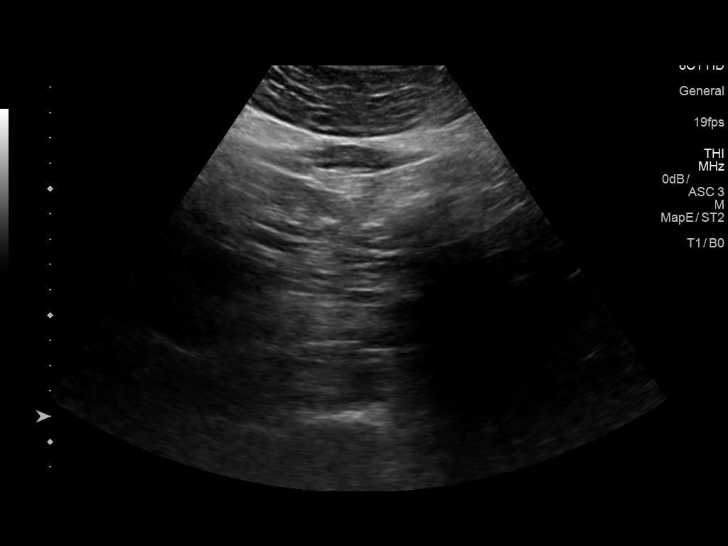

[14 of 25 positions shown; findings below may reference images not displayed]

FINDINGS: Gallbladder: The gallbladder wall is mildly thickened measuring
mm. Suboptimal visualization demonstrates no other abnormalities.

Common bile duct: Diameter: 4.5 mm

Liver: Increased echogenicity with no focal mass identified. Portal
vein is patent on color Doppler imaging with normal direction of
blood flow towards the liver.

IVC: No abnormality visualized.

Pancreas: Tail obscured.  Visualized portions normal.

Spleen: Size and appearance within normal limits.

Right Kidney: Length: 11.1 cm. Echogenicity within normal limits. No
mass or hydronephrosis visualized.

Left Kidney: Length: 11.3 cm. Echogenicity within normal limits. No
mass or hydronephrosis visualized.

Abdominal aorta: No aneurysm visualized.

Other findings: None.
IMPRESSION: 1. Gallbladder wall thickening, mild. This may be due to known
chronic hepatitis. Recommend clinical correlation.
2. Increased echogenicity in the liver could be due to the patient's
known hepatitis or steatosis. No focal mass.

## 2021-05-18 DEATH — deceased
